# Patient Record
Sex: Male | Born: 1990 | Race: Black or African American | Hispanic: No | Marital: Single | Smoking: Current every day smoker
Health system: Southern US, Community
[De-identification: ages and names within clinical notes are randomized; demographics above are authoritative.]

## PROBLEM LIST (undated history)

## (undated) DIAGNOSIS — E119 Type 2 diabetes mellitus without complications: Secondary | ICD-10-CM

## (undated) DIAGNOSIS — F1910 Other psychoactive substance abuse, uncomplicated: Secondary | ICD-10-CM

---
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
## 2013-03-09 ENCOUNTER — Emergency Department (INDEPENDENT_AMBULATORY_CARE_PROVIDER_SITE_OTHER)
Admission: EM | Admit: 2013-03-09 | Discharge: 2013-03-09 | Disposition: A | Discharge status: Home / Self Care | Payer: Self-pay | Source: Home / Self Care | Attending: Family Medicine | Admitting: Family Medicine

## 2013-03-09 ENCOUNTER — Encounter (HOSPITAL_COMMUNITY): Payer: Self-pay | Admitting: Emergency Medicine

## 2013-03-09 DIAGNOSIS — IMO0002 Reserved for concepts with insufficient information to code with codable children: Secondary | ICD-10-CM

## 2013-03-09 DIAGNOSIS — M79609 Pain in unspecified limb: Secondary | ICD-10-CM

## 2013-03-09 DIAGNOSIS — S39012A Strain of muscle, fascia and tendon of lower back, initial encounter: Secondary | ICD-10-CM

## 2013-03-09 DIAGNOSIS — G8929 Other chronic pain: Secondary | ICD-10-CM

## 2013-03-09 MED ORDER — TRAMADOL HCL 50 MG PO TABS: 50.0000 mg | ORAL_TABLET | Freq: Four times a day (QID) | ORAL | Status: DC | PRN

## 2013-03-09 MED ORDER — HYDROCODONE-ACETAMINOPHEN 5-325 MG PO TABS
ORAL_TABLET | ORAL | Status: AC
  Filled 2013-03-09: qty 1

## 2013-03-09 MED ORDER — CYCLOBENZAPRINE HCL 5 MG PO TABS: 5.0000 mg | ORAL_TABLET | Freq: Three times a day (TID) | ORAL | Status: DC | PRN

## 2013-03-09 MED ORDER — HYDROCODONE-ACETAMINOPHEN 5-325 MG PO TABS
1.0000 | ORAL_TABLET | Freq: Once | ORAL | Status: AC
  Administered 2013-03-09: 1 via ORAL

## 2013-03-09 MED ORDER — DICLOFENAC SODIUM 75 MG PO TBEC: 75.0000 mg | DELAYED_RELEASE_TABLET | Freq: Two times a day (BID) | ORAL | Status: DC

## 2013-03-09 NOTE — ED Provider Notes (Signed)
CSN: [id number]     Arrival date & time 03/09/13  1819 History   First MD Initiated Contact with Patient 03/09/13 1908     Chief Complaint  Patient presents with  . Back Pain   (Consider location/radiation/quality/duration/timing/severity/associated sxs/prior Treatment) Patient is a 22 y.o. male presenting with back pain. The history is provided by the patient.  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 Worse with standing. Better with brace. Has not tried ice/heat. States he is flat footed.  History reviewed. No pertinent past medical history. Past Surgical History  Procedure Laterality Date  . Hip surgery  2005    L hip dislocation-pin inserted    History reviewed. No pertinent family history. History  Substance Use Topics  . Smoking status: Current Every Day Smoker -- 0.50 packs/day    Types: Cigarettes  . Smokeless tobacco: Not on file  . Alcohol Use: Yes     Comment: occasional    Review of Systems  Constitutional: Negative for fever.  Gastrointestinal: Negative for bowel incontinence.  Genitourinary: Negative for bladder incontinence.  Musculoskeletal: Positive for back pain, gait problem and myalgias.  Skin: Negative for rash.  Neurological: Negative for tingling, weakness and numbness.   Allergies  Review of patient's allergies indicates no known allergies.  Home Medications   Current  Outpatient Rx  Name  Route  Sig  Dispense  Refill  . cyclobenzaprine (FLEXERIL) 5 MG tablet   Oral   Take 1 tablet (5 mg total) by mouth 3 (three) times daily as needed for muscle spasms (Will make you sleepy.).   10 tablet   0   . diclofenac (VOLTAREN) 75 MG EC tablet   Oral   Take 1 tablet (75 mg total) by mouth 2 (two) times daily.   30 tablet   0   . traMADol (ULTRAM) 50 MG tablet   Oral   Take 1 tablet (50 mg total) by mouth every 6 (six) hours as needed for pain.   10 tablet   0    BP 112/65  Pulse 91  Temp(Src) 98.4 F (36.9 C) (Oral)  Resp 16  SpO2 100% Physical Exam  Constitutional: He is oriented to person, place, and time. He appears well-developed and well-nourished. No distress.  HENT:  Head: Normocephalic and atraumatic.  Mouth/Throat: Oropharynx is clear and moist.  Eyes: Conjunctivae are normal. Pupils are equal, round, and reactive to light.  Neck: Normal range of motion. Neck supple.  Cardiovascular: Normal rate, regular rhythm and normal heart sounds.   No murmur heard. Pulmonary/Chest: Effort normal and breath sounds normal. He has no wheezes.  Abdominal: Soft. He exhibits no distension. There is no tenderness.  Obese  Musculoskeletal: Normal range of motion. He exhibits no edema.       Left ankle: He exhibits normal range of motion, no swelling and no deformity. Tenderness. Lateral malleolus tenderness found. Achilles tendon normal.  Lumbar back: He exhibits tenderness (Paraspinal). He exhibits normal range of motion and no deformity.  Good ROM of entire back. 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  Neurological: He is alert and oriented to person, place, and time.  Skin: Skin is warm and dry.  Psychiatric: He has a normal mood and affect.    ED Course  Procedures (including critical care time) Labs Review Labs Reviewed - No data to display Imaging Review No results found.   MDM    1. Back strain, initial encounter   2. Chronic foot pain, left    22 yo M with back pain and foot pain - Back pain likely secondary to muscle strain from overuse - Encouraged weight loss and better technique with heavy lifting - Vicodin x1 here, given Rx for Flexeril and Diclofenac. - Ankle is stable and no red flags. Intermittent pain with long standing suggestive that pain is from the anatomy of his foot, likely his fallen arches - Wear supportive shoes, avoid sandals/flip flops - Take breaks as needed - Establish PCP for closer follow up    Hilarie Fredrickson, MD 03/09/13 2016

## 2013-03-09 NOTE — ED Notes (Signed)
C/o back pain onset 2 mos. Ago.  C/o L foot pain onset 10/22, when he stands on it a long time.  No known injury to either one.

## 2013-03-11 NOTE — ED Provider Notes (Signed)
Medical screening examination/treatment/procedure(s) were performed by a resident physician or non-physician practitioner and as the supervising physician I was immediately available for consultation/collaboration.  Seven Marengo, MD  Asalee Barrette S Ashlyn Cabler, MD 03/11/13 0840 

## 2017-09-05 ENCOUNTER — Encounter (HOSPITAL_COMMUNITY): Payer: Self-pay

## 2017-09-05 ENCOUNTER — Emergency Department (HOSPITAL_COMMUNITY)
Admission: EM | Admit: 2017-09-05 | Discharge: 2017-09-05 | Disposition: A | Discharge status: Home / Self Care | Payer: Self-pay | Attending: Emergency Medicine | Admitting: Emergency Medicine

## 2017-09-05 DIAGNOSIS — Z79899 Other long term (current) drug therapy: Secondary | ICD-10-CM | POA: Insufficient documentation

## 2017-09-05 DIAGNOSIS — F17210 Nicotine dependence, cigarettes, uncomplicated: Secondary | ICD-10-CM | POA: Insufficient documentation

## 2017-09-05 DIAGNOSIS — H66001 Acute suppurative otitis media without spontaneous rupture of ear drum, right ear: Secondary | ICD-10-CM | POA: Insufficient documentation

## 2017-09-05 MED ORDER — IBUPROFEN 200 MG PO TABS
600.0000 mg | ORAL_TABLET | Freq: Once | ORAL | Status: AC
  Administered 2017-09-05: 600 mg via ORAL
  Filled 2017-09-05: qty 1

## 2017-09-05 MED ORDER — AMOXICILLIN-POT CLAVULANATE 875-125 MG PO TABS: 1.0000 | ORAL_TABLET | Freq: Two times a day (BID) | ORAL | 0 refills | Status: AC

## 2017-09-05 NOTE — ED Provider Notes (Signed)
[facility]   CSN: [id number] Arrival date & time: 09/05/17  1249     History   Chief Complaint Chief Complaint  Patient presents with  . Otalgia    HPI Jimmy Jarvis is a 27 y.o. male.  27yo M who p/w right ear pain.  The patient recently had a cold/upper respiratory illness with cough and fevers.  He states that his symptoms have been improving but he is continued to have congestion.  For the past several days, he has had right ear pain.  He woke up this morning and the pain was severe.  The pain has been throbbing and radiating to the right side of his face.  No drainage from his ear.  He does report decreased hearing on that side.  No medications prior to arrival.  The history is provided by the patient.  Otalgia     History reviewed. No pertinent past medical history.  There are no active problems to display for this patient.   Past Surgical History:  Procedure Laterality Date  . HIP SURGERY  2005   L hip dislocation-pin inserted         Home Medications    Prior to Admission medications   Medication Sig Start Date End Date Taking? Authorizing Provider  amoxicillin-clavulanate (AUGMENTIN) 875-125 MG tablet Take 1 tablet by mouth every 12 (twelve) hours for 10 days. 09/05/17 09/15/17  Brionna Romanek, Ambrose Finlandachel Morgan, MD  cyclobenzaprine (FLEXERIL) 5 MG tablet Take 1 tablet (5 mg total) by mouth 3 (three) times daily as needed for muscle spasms (Will make you sleepy.). 03/09/13   Hairford, Ricki MillerAmber M, MD  diclofenac (VOLTAREN) 75 MG EC tablet Take 1 tablet (75 mg total) by mouth 2 (two) times daily. 03/09/13   Hairford, Ricki MillerAmber M, MD  traMADol (ULTRAM) 50 MG tablet Take 1 tablet (50 mg total) by mouth every 6 (six) hours as needed for pain. 03/09/13   Hairford, Ricki MillerAmber M, MD    Family History No family history on file.  Social History Social History   Tobacco Use  . Smoking status: Current Every Day Smoker    Packs/day:  0.50    Types: Cigarettes  . Smokeless tobacco: Never Used  Substance Use Topics  . Alcohol use: Yes    Comment: occasional  . Drug use: No     Allergies   Patient has no known allergies.   Review of Systems Review of Systems  HENT: Positive for ear pain.    All other systems reviewed and are negative except that which was mentioned in HPI   Physical Exam Updated Vital Signs BP (!) 154/110 (BP Location: Left Arm)   Pulse 86   Temp 98.5 F (36.9 C) (Oral)   Resp 16   SpO2 98%   Physical Exam  Constitutional: He is oriented to person, place, and time. He appears well-developed and well-nourished. No distress.  HENT:  Head: Normocephalic and atraumatic.  Right Ear: No drainage. Tympanic membrane is erythematous and bulging. Tympanic membrane is not perforated.  Left Ear: Tympanic membrane and ear canal normal.  Mouth/Throat: Oropharynx is clear and moist.  Moist mucous membranes  Eyes: Pupils are equal, round, and reactive to light. Conjunctivae are normal.  Neck: Neck supple.  Cardiovascular: Normal rate, regular rhythm and normal heart sounds.  No murmur heard. Pulmonary/Chest: Effort normal and breath sounds normal.  Abdominal: Soft. Bowel sounds are normal. He exhibits no distension. There is no tenderness.  Musculoskeletal: He  exhibits no edema.  Lymphadenopathy:    He has no cervical adenopathy.  Neurological: He is alert and oriented to person, place, and time.  Fluent speech  Skin: Skin is warm and dry.  Psychiatric: He has a normal mood and affect. Judgment normal.  Nursing note and vitals reviewed.    ED Treatments / Results  Labs (all labs ordered are listed, but only abnormal results are displayed) Labs Reviewed - No data to display  EKG None  Radiology No results found.  Procedures Procedures (including critical care time)  Medications Ordered in ED Medications  ibuprofen (ADVIL,MOTRIN) tablet 600 mg (600 mg Oral Given 09/05/17 1354)      Initial Impression / Assessment and Plan / ED Course  I have reviewed the triage vital signs and the nursing notes.     R otitis media on exam without perforation. Recommended antibiotic treatment. Incidentally found to have HTN at triage. Recommended clinic f/u for recheck.  Discussed supportive measures for ear pain and reviewed return precautions.  Final Clinical Impressions(s) / ED Diagnoses   Final diagnoses:  Non-recurrent acute suppurative otitis media of right ear without spontaneous rupture of tympanic membrane    ED Discharge Orders        Ordered    amoxicillin-clavulanate (AUGMENTIN) 875-125 MG tablet  Every 12 hours     09/05/17 1350       Esaul Dorwart, Ambrose Finland, MD 09/05/17 1359

## 2017-09-05 NOTE — ED Triage Notes (Signed)
PT reports he has been experiencing right ear pain x several days and woke up this morning to ear throbbing, pain and swelling radiating into face.

## 2017-09-05 NOTE — ED Notes (Signed)
NAD at this time. Pt is stable and going home. Pt tried to ESign, however the signature pad is broken in 11.

## 2019-02-25 ENCOUNTER — Emergency Department (HOSPITAL_COMMUNITY)
Admission: EM | Admit: 2019-02-25 | Discharge: 2019-02-25 | Disposition: A | Discharge status: Home / Self Care | Payer: Self-pay | Attending: Emergency Medicine | Admitting: Emergency Medicine

## 2019-02-25 ENCOUNTER — Encounter (HOSPITAL_COMMUNITY): Payer: Self-pay | Admitting: *Deleted

## 2019-02-25 ENCOUNTER — Other Ambulatory Visit: Payer: Self-pay

## 2019-02-25 DIAGNOSIS — F1910 Other psychoactive substance abuse, uncomplicated: Secondary | ICD-10-CM | POA: Insufficient documentation

## 2019-02-25 DIAGNOSIS — F17210 Nicotine dependence, cigarettes, uncomplicated: Secondary | ICD-10-CM | POA: Insufficient documentation

## 2019-02-25 DIAGNOSIS — Z79899 Other long term (current) drug therapy: Secondary | ICD-10-CM | POA: Insufficient documentation

## 2019-02-25 DIAGNOSIS — R1084 Generalized abdominal pain: Secondary | ICD-10-CM | POA: Insufficient documentation

## 2019-02-25 MED ORDER — NAPROXEN 500 MG PO TABS: 500.0000 mg | ORAL_TABLET | Freq: Two times a day (BID) | ORAL | Status: DC | PRN

## 2019-02-25 MED ORDER — DICYCLOMINE HCL 20 MG PO TABS
20.0000 mg | ORAL_TABLET | Freq: Four times a day (QID) | ORAL | Status: DC | PRN
  Administered 2019-02-25: 06:00:00 20 mg via ORAL
  Filled 2019-02-25: qty 1

## 2019-02-25 MED ORDER — ONDANSETRON 4 MG PO TBDP: 4.0000 mg | ORAL_TABLET | Freq: Three times a day (TID) | ORAL | 0 refills | Status: DC | PRN

## 2019-02-25 MED ORDER — NAPROXEN 500 MG PO TABS: 500.0000 mg | ORAL_TABLET | Freq: Two times a day (BID) | ORAL | 0 refills | Status: DC

## 2019-02-25 MED ORDER — HYDROXYZINE HCL 25 MG PO TABS: 25.0000 mg | ORAL_TABLET | Freq: Four times a day (QID) | ORAL | Status: DC | PRN

## 2019-02-25 MED ORDER — CLONIDINE HCL 0.1 MG PO TABS
0.1000 mg | ORAL_TABLET | Freq: Once | ORAL | Status: AC
  Administered 2019-02-25: 0.1 mg via ORAL
  Filled 2019-02-25: qty 1

## 2019-02-25 MED ORDER — ONDANSETRON 8 MG PO TBDP
8.0000 mg | ORAL_TABLET | Freq: Once | ORAL | Status: AC
  Administered 2019-02-25: 06:00:00 8 mg via ORAL
  Filled 2019-02-25: qty 1

## 2019-02-25 MED ORDER — HYDROXYZINE HCL 25 MG PO TABS: 25.0000 mg | ORAL_TABLET | Freq: Four times a day (QID) | ORAL | 0 refills | Status: DC

## 2019-02-25 NOTE — ED Provider Notes (Signed)
[place] DEPT Provider Note   CSN: [id number] Arrival date & time: 02/25/19  0449     History   Chief Complaint Chief Complaint  Patient presents with  . Drug Problem    HPI Jimmy Jarvis is a 28 y.o. male.     Patient presents to the emergency department with a chief complaint of opiate withdrawal.  He states that he got a new job, and has been detoxing off of oxycodone and fentanyl.  He states that his last use was a day and a half ago.  He complains of nausea, abdominal cramps, and anxiousness.  He denies any diarrhea or body aches/muscle spasms.  Denies any other associated symptoms.  He has not taken anything for his symptoms.  The history is provided by the patient. No language interpreter was used.    History reviewed. No pertinent past medical history.  There are no active problems to display for this patient.   Past Surgical History:  Procedure Laterality Date  . HIP SURGERY  2005   L hip dislocation-pin inserted         Home Medications    Prior to Admission medications   Medication Sig Start Date End Date Taking? Authorizing Provider  cyclobenzaprine (FLEXERIL) 5 MG tablet Take 1 tablet (5 mg total) by mouth 3 (three) times daily as needed for muscle spasms (Will make you sleepy.). 03/09/13   Hairford, Tyler Pita, MD  diclofenac (VOLTAREN) 75 MG EC tablet Take 1 tablet (75 mg total) by mouth 2 (two) times daily. 03/09/13   Hairford, Tyler Pita, MD  hydrOXYzine (ATARAX/VISTARIL) 25 MG tablet Take 1 tablet (25 mg total) by mouth every 6 (six) hours. 02/25/19   Montine Circle, PA-C  naproxen (NAPROSYN) 500 MG tablet Take 1 tablet (500 mg total) by mouth 2 (two) times daily with a meal. 02/25/19   Montine Circle, PA-C  ondansetron (ZOFRAN ODT) 4 MG disintegrating tablet Take 1 tablet (4 mg total) by mouth every 8 (eight) hours as needed for nausea or vomiting. 02/25/19   Montine Circle, PA-C  traMADol (ULTRAM) 50 MG tablet  Take 1 tablet (50 mg total) by mouth every 6 (six) hours as needed for pain. 03/09/13   Hairford, Tyler Pita, MD    Family History No family history on file.  Social History Social History   Tobacco Use  . Smoking status: Current Every Day Smoker    Packs/day: 0.50    Types: Cigarettes  . Smokeless tobacco: Never Used  Substance Use Topics  . Alcohol use: Yes    Comment: occasional  . Drug use: No     Allergies   Patient has no known allergies.   Review of Systems Review of Systems  All other systems reviewed and are negative.    Physical Exam Updated Vital Signs BP (!) 151/82   Pulse 72   Temp 98.7 F (37.1 C) (Oral)   Resp 20   SpO2 98%   Physical Exam Vitals signs and nursing note reviewed.  Constitutional:      Appearance: He is well-developed.  HENT:     Head: Normocephalic and atraumatic.  Eyes:     Conjunctiva/sclera: Conjunctivae normal.  Neck:     Musculoskeletal: Neck supple.  Cardiovascular:     Rate and Rhythm: Normal rate and regular rhythm.     Heart sounds: No murmur.  Pulmonary:     Effort: Pulmonary effort is normal. No respiratory distress.     Breath sounds: Normal  breath sounds.  Abdominal:     Palpations: Abdomen is soft.     Tenderness: There is no abdominal tenderness.  Musculoskeletal: Normal range of motion.  Skin:    General: Skin is warm and dry.  Neurological:     Mental Status: He is alert and oriented to person, place, and time.  Psychiatric:        Mood and Affect: Mood normal.        Behavior: Behavior normal.      ED Treatments / Results  Labs (all labs ordered are listed, but only abnormal results are displayed) Labs Reviewed - No data to display  EKG None  Radiology No results found.  Procedures Procedures (including critical care time)  Medications Ordered in ED Medications  ondansetron (ZOFRAN-ODT) disintegrating tablet 8 mg (has no administration in time range)  dicyclomine (BENTYL) tablet 20 mg  (has no administration in time range)  hydrOXYzine (ATARAX/VISTARIL) tablet 25 mg (has no administration in time range)  naproxen (NAPROSYN) tablet 500 mg (has no administration in time range)  cloNIDine (CATAPRES) tablet 0.1 mg (has no administration in time range)     Initial Impression / Assessment and Plan / ED Course  I have reviewed the triage vital signs and the nursing notes.  Pertinent labs & imaging results that were available during my care of the patient were reviewed by me and considered in my medical decision making (see chart for details).        Patient here for opiate withdrawal.  Vital signs are stable.  Patient given symptomatic treatment in the ED.  Offered outpatient resources, but patient declined.  Final Clinical Impressions(s) / ED Diagnoses   Final diagnoses:  Polysubstance abuse [facility])    ED Discharge Orders         Ordered    hydrOXYzine (ATARAX/VISTARIL) 25 MG tablet  Every 6 hours     02/25/19 0538    ondansetron (ZOFRAN ODT) 4 MG disintegrating tablet  Every 8 hours PRN     02/25/19 0538    naproxen (NAPROSYN) 500 MG tablet  2 times daily with meals     02/25/19 0538           Roxy Horseman, PA-C 02/25/19 0540    Nira Conn, MD 02/28/19 2109

## 2019-02-25 NOTE — ED Triage Notes (Signed)
Pt arrives with c/o wanting help with withdrawal symptoms. Reports has been using fentanyl and percocet (snorting) for about 2 years. Last use about 3 days ago.

## 2019-02-25 NOTE — ED Notes (Signed)
Patient states that he feels like he going thru withdrawal, feeling hot and cold, restless and tingling sensation in the throat .

## 2019-10-04 ENCOUNTER — Ambulatory Visit: Payer: Self-pay | Admitting: Family Medicine

## 2019-11-08 ENCOUNTER — Ambulatory Visit: Payer: Self-pay | Admitting: Family Medicine

## 2019-11-27 ENCOUNTER — Ambulatory Visit: Payer: Self-pay | Attending: Family Medicine | Admitting: Internal Medicine

## 2019-11-27 ENCOUNTER — Other Ambulatory Visit: Payer: Self-pay

## 2021-12-17 ENCOUNTER — Emergency Department (HOSPITAL_COMMUNITY)
Admission: EM | Admit: 2021-12-17 | Discharge: 2021-12-18 | Disposition: A | Discharge status: Home / Self Care | Payer: Self-pay | Attending: Emergency Medicine | Admitting: Emergency Medicine

## 2021-12-17 ENCOUNTER — Encounter (HOSPITAL_COMMUNITY): Payer: Self-pay | Admitting: *Deleted

## 2021-12-17 ENCOUNTER — Other Ambulatory Visit: Payer: Self-pay

## 2021-12-17 DIAGNOSIS — I10 Essential (primary) hypertension: Secondary | ICD-10-CM | POA: Insufficient documentation

## 2021-12-17 DIAGNOSIS — F1193 Opioid use, unspecified with withdrawal: Secondary | ICD-10-CM | POA: Insufficient documentation

## 2021-12-17 DIAGNOSIS — T44905A Adverse effect of unspecified drugs primarily affecting the autonomic nervous system, initial encounter: Secondary | ICD-10-CM

## 2021-12-17 DIAGNOSIS — R112 Nausea with vomiting, unspecified: Secondary | ICD-10-CM | POA: Insufficient documentation

## 2021-12-17 DIAGNOSIS — Z79899 Other long term (current) drug therapy: Secondary | ICD-10-CM | POA: Insufficient documentation

## 2021-12-17 DIAGNOSIS — E119 Type 2 diabetes mellitus without complications: Secondary | ICD-10-CM | POA: Insufficient documentation

## 2021-12-17 DIAGNOSIS — F200 Paranoid schizophrenia: Secondary | ICD-10-CM | POA: Insufficient documentation

## 2021-12-17 HISTORY — DX: Other psychoactive substance abuse, uncomplicated: F19.10

## 2021-12-17 HISTORY — DX: Type 2 diabetes mellitus without complications: E11.9

## 2021-12-17 LAB — RAPID URINE DRUG SCREEN, HOSP PERFORMED
Amphetamines: POSITIVE — AB
Barbiturates: NOT DETECTED
Benzodiazepines: POSITIVE — AB
Cocaine: POSITIVE — AB
Opiates: NOT DETECTED
Tetrahydrocannabinol: NOT DETECTED

## 2021-12-17 LAB — CBG MONITORING, ED: Glucose-Capillary: 91 mg/dL (ref 70–99)

## 2021-12-17 LAB — COMPREHENSIVE METABOLIC PANEL
ALT: 24 U/L (ref 0–44)
AST: 36 U/L (ref 15–41)
Albumin: 4.9 g/dL (ref 3.5–5.0)
Alkaline Phosphatase: 91 U/L (ref 38–126)
Anion gap: 12 (ref 5–15)
BUN: 14 mg/dL (ref 6–20)
CO2: 25 mmol/L (ref 22–32)
Calcium: 10.3 mg/dL (ref 8.9–10.3)
Chloride: 104 mmol/L (ref 98–111)
Creatinine, Ser: 1.18 mg/dL (ref 0.61–1.24)
GFR, Estimated: >60 mL/min (ref >60)
Glucose, Bld: 104 mg/dL — ABNORMAL HIGH (ref 70–99)
Potassium: 3.5 mmol/L (ref 3.5–5.1)
Sodium: 141 mmol/L (ref 135–145)
Total Bilirubin: 1.2 mg/dL (ref 0.3–1.2)
Total Protein: 9.2 g/dL — ABNORMAL HIGH (ref 6.5–8.1)

## 2021-12-17 LAB — CBC
HCT: 56.1 % — ABNORMAL HIGH (ref 39.0–52.0)
Hemoglobin: 17.4 g/dL — ABNORMAL HIGH (ref 13.0–17.0)
MCH: 22.3 pg — ABNORMAL LOW (ref 26.0–34.0)
MCHC: 31 g/dL (ref 30.0–36.0)
MCV: 72 fL — ABNORMAL LOW (ref 80.0–100.0)
Platelets: 329 10*3/uL (ref 150–400)
RBC: 7.79 MIL/uL — ABNORMAL HIGH (ref 4.22–5.81)
RDW: 17.4 % — ABNORMAL HIGH (ref 11.5–15.5)
WBC: 9.4 10*3/uL (ref 4.0–10.5)
nRBC: 0 % (ref 0.0–0.2)

## 2021-12-17 LAB — MAGNESIUM: Magnesium: 2.1 mg/dL (ref 1.7–2.4)

## 2021-12-17 LAB — ETHANOL: Alcohol, Ethyl (B): <10 mg/dL (ref <10)

## 2021-12-17 MED ORDER — LACTATED RINGERS IV BOLUS
1000.0000 mL | Freq: Once | INTRAVENOUS | Status: AC
  Administered 2021-12-17: 1000 mL via INTRAVENOUS

## 2021-12-17 MED ORDER — PROCHLORPERAZINE EDISYLATE 10 MG/2ML IJ SOLN
10.0000 mg | INTRAMUSCULAR | Status: AC
  Administered 2021-12-17: 10 mg via INTRAVENOUS
  Filled 2021-12-17: qty 2

## 2021-12-17 MED ORDER — MIDAZOLAM HCL 2 MG/2ML IJ SOLN
4.0000 mg | Freq: Once | INTRAMUSCULAR | Status: AC
  Administered 2021-12-17: 4 mg via INTRAVENOUS

## 2021-12-17 MED ORDER — LACTATED RINGERS IV BOLUS
1000.0000 mL | Freq: Once | INTRAVENOUS | Status: AC
  Administered 2021-12-18: 1000 mL via INTRAVENOUS

## 2021-12-17 MED ORDER — ONDANSETRON HCL 4 MG/2ML IJ SOLN
4.0000 mg | Freq: Once | INTRAMUSCULAR | Status: AC
Start: 2021-12-17 — End: 2021-12-17
  Administered 2021-12-17: 4 mg via INTRAVENOUS
  Filled 2021-12-17: qty 2

## 2021-12-17 MED ORDER — MIDAZOLAM HCL 2 MG/2ML IJ SOLN
4.0000 mg | Freq: Once | INTRAMUSCULAR | Status: AC
  Administered 2021-12-17: 4 mg via INTRAVENOUS
  Filled 2021-12-17: qty 4

## 2021-12-17 MED ORDER — DIPHENHYDRAMINE HCL 50 MG/ML IJ SOLN
25.0000 mg | Freq: Once | INTRAMUSCULAR | Status: AC
  Administered 2021-12-17: 25 mg via INTRAVENOUS
  Filled 2021-12-17: qty 1

## 2021-12-17 MED ORDER — MIDAZOLAM HCL (PF) 10 MG/2ML IJ SOLN
INTRAMUSCULAR | Status: AC
  Filled 2021-12-17: qty 2

## 2021-12-17 MED ORDER — BUPRENORPHINE HCL-NALOXONE HCL 2-0.5 MG SL SUBL
2.0000 | SUBLINGUAL_TABLET | SUBLINGUAL | Status: AC
  Administered 2021-12-17: 2 via SUBLINGUAL
  Filled 2021-12-17: qty 2

## 2021-12-17 MED ORDER — DIAZEPAM 5 MG/ML IJ SOLN
10.0000 mg | Freq: Once | INTRAMUSCULAR | Status: AC
  Administered 2021-12-18: 10 mg via INTRAVENOUS
  Filled 2021-12-17: qty 2

## 2021-12-17 MED ORDER — BUPRENORPHINE HCL-NALOXONE HCL 2-0.5 MG SL SUBL
2.0000 | SUBLINGUAL_TABLET | SUBLINGUAL | Status: AC
  Administered 2021-12-18: 2 via SUBLINGUAL
  Filled 2021-12-17: qty 2

## 2021-12-17 NOTE — ED Triage Notes (Addendum)
BIB EMS with withdrawals after 2 days without Heroin. Paranoid and Hallucinations, vomiting, shaky chills. CBG [facility] told EMS he has been ICE and Meth.

## 2021-12-17 NOTE — ED Provider Notes (Addendum)
  Provider Note MRN:  [id number]  Arrival date & time: 12/18/21    ED Course and Medical Decision Making  Assumed care from Dr. Eloise Harman at shift change.  Patient endorsing opioid withdrawal but we also expect sympathomimetic toxidrome, currently not tolerating p.o., awaiting symptomatic management, repeat labs, will reassess.  Patient is sitting up in bed, no acute distress, no toxidrome and features at this time, CK mildly elevated at 1500 with no acute kidney injury, advised repeat check with PCP or here in the emergency department within the next week or so.  Appropriate for discharge.  Procedures  Final Clinical Impressions(s) / ED Diagnoses     ICD-10-CM   1. Adverse effect of sympathomimetics, initial encounter  T44.905A     2. Opioid withdrawal (HCC)  F11.93     3. Nausea and vomiting, unspecified vomiting type  R11.2       ED Discharge Orders     None         Discharge Instructions      You were evaluated in the Emergency Department and after careful evaluation, we did not find any emergent condition requiring admission or further testing in the hospital.  Your exam/testing today was overall reassuring.  Important that you stop using illicit drugs and follow-up closely with your primary care doctor.  Also recommend following up with the outpatient substance use resources.  Please return to the Emergency Department if you experience any worsening of your condition.  Thank you for allowing [place] to be a part of your care.       Elmer Sow. Pilar Plate, MD [facility] Emergency Medicine [facility] [email or web address]    Sabas Sous, MD 12/18/21 3202    Sabas Sous, MD 12/18/21 [phone]

## 2021-12-17 NOTE — BH Assessment (Signed)
Clinician messaged Electa Sniff, RN: "Hey. It's Jimmy Jarvis with TTS. Is the pt able to engage in the assessment, if so the pt will need to be placed in a private room. Is the pt under IVC? Also is the pt medically cleared?"   Clinician awaiting response.    Redmond Pulling, MS, [facility], [facility] Triage Specialist [phone]

## 2021-12-17 NOTE — ED Provider Notes (Signed)
[place] DEPT Provider Note   CSN: [id number] Arrival date & time: 12/17/21  1557     History  Chief Complaint  Patient presents with   Paranoid   Hallucinations    Jimmy Jarvis is a 31 y.o. male.  31 year old male with a history of polysubstance abuse and diabetes who presents to the emergency department with concerns for withdrawal.  Initial report from EMS is that the patient was paranoid and having hallucinations.  However, after discussing with the patient he has had no audiovisual hallucinations.  Says that instead he is withdrawing from reports that and said he is withdrawing from heroin and "ice" (believed to be methamphetamine).  States that he last used heroin yesterday and ice today.  Denies any chest pain or shortness of breath.  Does feel very anxious with tremor, nausea, vomiting, and some mild diarrhea.  Says that he smokes and snorts his heroin and does not inject.  Says he is not having thoughts of harming himself or others at this time.    Past Medical History:  Diagnosis Date   Diabetes mellitus without complication ([place])    Drug abuse ([place])         Home Medications Prior to Admission medications   Medication Sig Start Date End Date Taking? Authorizing Provider  cyclobenzaprine (FLEXERIL) 5 MG tablet Take 1 tablet (5 mg total) by mouth 3 (three) times daily as needed for muscle spasms (Will make you sleepy.). 03/09/13   Hairford, Tyler Pita, MD  diclofenac (VOLTAREN) 75 MG EC tablet Take 1 tablet (75 mg total) by mouth 2 (two) times daily. 03/09/13   Hairford, Tyler Pita, MD  hydrOXYzine (ATARAX/VISTARIL) 25 MG tablet Take 1 tablet (25 mg total) by mouth every 6 (six) hours. 02/25/19   Montine Circle, PA-C  naproxen (NAPROSYN) 500 MG tablet Take 1 tablet (500 mg total) by mouth 2 (two) times daily with a meal. 02/25/19   Montine Circle, PA-C  ondansetron (ZOFRAN ODT) 4 MG disintegrating tablet Take 1 tablet (4 mg total) by  mouth every 8 (eight) hours as needed for nausea or vomiting. 02/25/19   Montine Circle, PA-C  traMADol (ULTRAM) 50 MG tablet Take 1 tablet (50 mg total) by mouth every 6 (six) hours as needed for pain. 03/09/13   Hairford, Tyler Pita, MD      Allergies    Patient has no known allergies.    Review of Systems   Review of Systems  Physical Exam Updated Vital Signs BP (!) 184/101 (BP Location: Left Wrist)   Pulse 88   Temp 100.1 F (37.8 C) (Oral)   Resp (!) 25   SpO2 97%  Physical Exam Vitals and nursing note reviewed.  Constitutional:      Appearance: He is well-developed. He is diaphoretic.     Comments: Anxious pacing about room.  Occasionally vomiting into handheld emesis bag.  Answering questions appropriately.  HENT:     Head: Normocephalic and atraumatic.     Right Ear: External ear normal.     Left Ear: External ear normal.     Nose: Nose normal.     Mouth/Throat:     Mouth: Mucous membranes are moist.     Pharynx: Oropharynx is clear.  Eyes:     Extraocular Movements: Extraocular movements intact.     Conjunctiva/sclera: Conjunctivae normal.     Pupils: Pupils are equal, round, and reactive to light.  Cardiovascular:     Rate and Rhythm: Regular rhythm. Tachycardia  present.     Pulses: Normal pulses.     Heart sounds: Normal heart sounds. No murmur heard. Pulmonary:     Effort: Pulmonary effort is normal. No respiratory distress.     Breath sounds: Normal breath sounds.  Abdominal:     General: There is no distension.     Palpations: Abdomen is soft.  Musculoskeletal:        General: No swelling.     Cervical back: Neck supple.     Right lower leg: No edema.     Left lower leg: No edema.  Skin:    General: Skin is warm.     Capillary Refill: Capillary refill takes 2 to 3 seconds.  Neurological:     General: No focal deficit present.     Mental Status: He is alert and oriented to person, place, and time.  Psychiatric:     Comments: Anxious.  Is not  responding to internal stimuli.  Has coherent thought processes.     ED Results / Procedures / Treatments   Labs (all labs ordered are listed, but only abnormal results are displayed) Labs Reviewed  COMPREHENSIVE METABOLIC PANEL - Abnormal; Notable for the following components:      Result Value   Glucose, Bld 104 (*)    Total Protein 9.2 (*)    All other components within normal limits  CBC - Abnormal; Notable for the following components:   RBC 7.79 (*)    Hemoglobin 17.4 (*)    HCT 56.1 (*)    MCV 72.0 (*)    MCH 22.3 (*)    RDW 17.4 (*)    All other components within normal limits  RAPID URINE DRUG SCREEN, HOSP PERFORMED - Abnormal; Notable for the following components:   Cocaine POSITIVE (*)    Benzodiazepines POSITIVE (*)    Amphetamines POSITIVE (*)    All other components within normal limits  ETHANOL  MAGNESIUM  CK  BASIC METABOLIC PANEL  MAGNESIUM  PHOSPHORUS  CBG MONITORING, ED    EKG EKG Interpretation  Date/Time:  Thursday December 17 2021 16:24:59 EDT Ventricular Rate:  123 PR Interval:  155 QRS Duration: 115 QT Interval:  351 QTC Calculation: 432 R Axis:   99 Text Interpretation: Sinus tachycardia Multiform ventricular premature complexes Nonspecific intraventricular conduction delay ST elev, probable normal early repol pattern Confirmed by Kennis Carina [phone]) on 12/17/2021 11:38:45 PM  Radiology No results found.  Procedures Procedures  Telemetry interpretation - Atrial Bigeminy  Medications Ordered in ED Medications  midazolam PF (VERSED) 10 MG/2ML injection (has no administration in time range)  midazolam (VERSED) injection 4 mg (4 mg Intravenous Given 12/17/21 1700)  prochlorperazine (COMPAZINE) injection 10 mg (10 mg Intravenous Given 12/17/21 1659)  diphenhydrAMINE (BENADRYL) injection 25 mg (25 mg Intravenous Given 12/17/21 1801)  lactated ringers bolus 1,000 mL (1,000 mLs Intravenous New Bag/Given 12/17/21 1810)  ondansetron (ZOFRAN) injection  4 mg (4 mg Intravenous Given 12/17/21 1842)  buprenorphine-naloxone (SUBOXONE) 2-0.5 mg per SL tablet 2 tablet (2 tablets Sublingual Given 12/17/21 2050)  midazolam (VERSED) injection 4 mg (4 mg Intravenous Given 12/17/21 2042)  buprenorphine-naloxone (SUBOXONE) 2-0.5 mg per SL tablet 2 tablet (2 tablets Sublingual Given 12/18/21 0028)  diazepam (VALIUM) injection 10 mg (10 mg Intravenous Given 12/18/21 0028)  lactated ringers bolus 1,000 mL (1,000 mLs Intravenous Bolus 12/18/21 0032)    ED Course/ Medical Decision Making/ A&P  Medical Decision Making 31 year old male he of polysubstance abuse who presents emergency department with concerns for withdrawal, anxiety, and nausea and vomiting.  Appears that the patient may be withdrawing from opiates while still intoxicated with sympathomimetics.  Appears the patient did ice earlier today which is likely methamphetamine.  He had a urine drug screen that was positive for cocaine and methamphetamine which supports this.  He is having nausea, vomiting, and diarrhea likely from his opiate withdrawal.  Patient denies any chest pain at this time.  Labs were sent which did not show any abnormalities aside from elevated hemoglobin likely due to hemoconcentration.  Patient was given several antiemetics as well as benzodiazepines with only mild improvement of his symptoms.  Did also give the patient Suboxone for his opiate withdrawals which mildly improved his nausea and vomiting but he still appears very anxious, tachycardic, hypertensive and started to develop a fever which likely is due to the sympathomimetics.  Additional benzodiazepines were given.  He was then signed out to Dr. Pilar Plate for further management and final disposition.  Amount and/or Complexity of Data Reviewed Labs: ordered.  Risk Prescription drug management.     Final Clinical Impression(s) / ED Diagnoses Final diagnoses:  Opioid withdrawal (HCC)  Adverse effect of  sympathomimetics, initial encounter  Nausea and vomiting, unspecified vomiting type    Rx / DC Orders ED Discharge Orders     None         Rondel Baton, MD 12/18/21 [phone]

## 2021-12-17 NOTE — ED Notes (Signed)
Security Radio producer and security has secured it with them in safe

## 2021-12-17 NOTE — BH Assessment (Signed)
Per Electa Sniff, RN, the pt will not be able to answer any questions at the moment, "he's out of it." Clinician asked the RN to let her know when the pt is able to engage in the assessment.    Redmond Pulling, MS, [facility], [facility] Triage Specialist [phone]

## 2021-12-17 NOTE — ED Notes (Signed)
Pt continues to be restless and taking off all the electrodes and equipment. Pt states "he can't have anything touching him for periods of time. I am monitoring him and doing vitals as they come up. Pt is not aggressive.

## 2021-12-18 ENCOUNTER — Emergency Department (HOSPITAL_COMMUNITY): Payer: Self-pay

## 2021-12-18 ENCOUNTER — Ambulatory Visit (HOSPITAL_COMMUNITY)
Admission: EM | Admit: 2021-12-18 | Discharge: 2021-12-18 | Disposition: A | Discharge status: Other Acute Inpatient Hospital | Payer: No Payment, Other | Attending: Behavioral Health | Admitting: Behavioral Health

## 2021-12-18 ENCOUNTER — Inpatient Hospital Stay (HOSPITAL_COMMUNITY): Payer: Self-pay

## 2021-12-18 ENCOUNTER — Inpatient Hospital Stay (HOSPITAL_COMMUNITY)
Admission: EM | Admit: 2021-12-18 | Discharge: 2021-12-23 | DRG: 896 | Disposition: A | Discharge status: Other Acute Inpatient Hospital | Payer: Self-pay | Attending: Internal Medicine | Admitting: Internal Medicine

## 2021-12-18 DIAGNOSIS — F19951 Other psychoactive substance use, unspecified with psychoactive substance-induced psychotic disorder with hallucinations: Secondary | ICD-10-CM | POA: Diagnosis not present

## 2021-12-18 DIAGNOSIS — M6282 Rhabdomyolysis: Secondary | ICD-10-CM | POA: Diagnosis present

## 2021-12-18 DIAGNOSIS — F1510 Other stimulant abuse, uncomplicated: Secondary | ICD-10-CM | POA: Insufficient documentation

## 2021-12-18 DIAGNOSIS — G929 Unspecified toxic encephalopathy: Secondary | ICD-10-CM | POA: Diagnosis present

## 2021-12-18 DIAGNOSIS — R4182 Altered mental status, unspecified: Principal | ICD-10-CM

## 2021-12-18 DIAGNOSIS — E119 Type 2 diabetes mellitus without complications: Secondary | ICD-10-CM | POA: Diagnosis present

## 2021-12-18 DIAGNOSIS — J690 Pneumonitis due to inhalation of food and vomit: Secondary | ICD-10-CM

## 2021-12-18 DIAGNOSIS — I77810 Thoracic aortic ectasia: Secondary | ICD-10-CM | POA: Diagnosis present

## 2021-12-18 DIAGNOSIS — I10 Essential (primary) hypertension: Secondary | ICD-10-CM | POA: Diagnosis present

## 2021-12-18 DIAGNOSIS — F1113 Opioid abuse with withdrawal: Principal | ICD-10-CM | POA: Diagnosis present

## 2021-12-18 DIAGNOSIS — Z6836 Body mass index (BMI) 36.0-36.9, adult: Secondary | ICD-10-CM

## 2021-12-18 DIAGNOSIS — Z560 Unemployment, unspecified: Secondary | ICD-10-CM | POA: Insufficient documentation

## 2021-12-18 DIAGNOSIS — G4700 Insomnia, unspecified: Secondary | ICD-10-CM | POA: Diagnosis present

## 2021-12-18 DIAGNOSIS — F1910 Other psychoactive substance abuse, uncomplicated: Secondary | ICD-10-CM

## 2021-12-18 DIAGNOSIS — G928 Other toxic encephalopathy: Secondary | ICD-10-CM | POA: Diagnosis present

## 2021-12-18 DIAGNOSIS — J9601 Acute respiratory failure with hypoxia: Secondary | ICD-10-CM | POA: Diagnosis present

## 2021-12-18 DIAGNOSIS — R339 Retention of urine, unspecified: Secondary | ICD-10-CM | POA: Diagnosis present

## 2021-12-18 DIAGNOSIS — J189 Pneumonia, unspecified organism: Secondary | ICD-10-CM | POA: Diagnosis present

## 2021-12-18 DIAGNOSIS — G9340 Encephalopathy, unspecified: Secondary | ICD-10-CM

## 2021-12-18 DIAGNOSIS — N170 Acute kidney failure with tubular necrosis: Secondary | ICD-10-CM | POA: Diagnosis present

## 2021-12-18 DIAGNOSIS — E876 Hypokalemia: Secondary | ICD-10-CM | POA: Diagnosis present

## 2021-12-18 DIAGNOSIS — N179 Acute kidney failure, unspecified: Secondary | ICD-10-CM | POA: Diagnosis present

## 2021-12-18 DIAGNOSIS — Z9151 Personal history of suicidal behavior: Secondary | ICD-10-CM | POA: Insufficient documentation

## 2021-12-18 DIAGNOSIS — F17210 Nicotine dependence, cigarettes, uncomplicated: Secondary | ICD-10-CM | POA: Diagnosis present

## 2021-12-18 DIAGNOSIS — Z20822 Contact with and (suspected) exposure to covid-19: Secondary | ICD-10-CM | POA: Insufficient documentation

## 2021-12-18 DIAGNOSIS — F1513 Other stimulant abuse with withdrawal: Secondary | ICD-10-CM | POA: Diagnosis present

## 2021-12-18 DIAGNOSIS — F1410 Cocaine abuse, uncomplicated: Secondary | ICD-10-CM | POA: Diagnosis present

## 2021-12-18 DIAGNOSIS — R45851 Suicidal ideations: Secondary | ICD-10-CM | POA: Insufficient documentation

## 2021-12-18 DIAGNOSIS — F32A Depression, unspecified: Secondary | ICD-10-CM | POA: Insufficient documentation

## 2021-12-18 DIAGNOSIS — J13 Pneumonia due to Streptococcus pneumoniae: Secondary | ICD-10-CM | POA: Diagnosis present

## 2021-12-18 DIAGNOSIS — Z5900 Homelessness unspecified: Secondary | ICD-10-CM | POA: Insufficient documentation

## 2021-12-18 DIAGNOSIS — R7303 Prediabetes: Secondary | ICD-10-CM | POA: Diagnosis present

## 2021-12-18 DIAGNOSIS — F1193 Opioid use, unspecified with withdrawal: Secondary | ICD-10-CM

## 2021-12-18 LAB — I-STAT ARTERIAL BLOOD GAS, ED
Acid-base deficit: 2 mmol/L (ref 0.0–2.0)
Bicarbonate: 22.3 mmol/L (ref 20.0–28.0)
Calcium, Ion: 1.22 mmol/L (ref 1.15–1.40)
HCT: 51 % (ref 39.0–52.0)
Hemoglobin: 17.3 g/dL — ABNORMAL HIGH (ref 13.0–17.0)
O2 Saturation: 94 %
Patient temperature: 100.2
Potassium: 3.4 mmol/L — ABNORMAL LOW (ref 3.5–5.1)
Sodium: 143 mmol/L (ref 135–145)
TCO2: 23 mmol/L (ref 22–32)
pCO2 arterial: 39.5 mmHg (ref 32–48)
pH, Arterial: 7.364 (ref 7.35–7.45)
pO2, Arterial: 78 mmHg — ABNORMAL LOW (ref 83–108)

## 2021-12-18 LAB — LIPID PANEL
Cholesterol: 186 mg/dL (ref 0–200)
HDL: 49 mg/dL (ref >40)
LDL Cholesterol: 119 mg/dL — ABNORMAL HIGH (ref 0–99)
Total CHOL/HDL Ratio: 3.8 RATIO
Triglycerides: 89 mg/dL (ref <150)
VLDL: 18 mg/dL (ref 0–40)

## 2021-12-18 LAB — URINALYSIS, ROUTINE W REFLEX MICROSCOPIC
Bilirubin Urine: NEGATIVE
Glucose, UA: NEGATIVE mg/dL
Ketones, ur: 5 mg/dL — AB
Leukocytes,Ua: NEGATIVE
Nitrite: NEGATIVE
Protein, ur: NEGATIVE mg/dL
Specific Gravity, Urine: 1.013 (ref 1.005–1.030)
pH: 6 (ref 5.0–8.0)

## 2021-12-18 LAB — RAPID URINE DRUG SCREEN, HOSP PERFORMED
Amphetamines: POSITIVE — AB
Barbiturates: NOT DETECTED
Benzodiazepines: POSITIVE — AB
Cocaine: NOT DETECTED
Opiates: NOT DETECTED
Tetrahydrocannabinol: NOT DETECTED

## 2021-12-18 LAB — COMPREHENSIVE METABOLIC PANEL
ALT: 37 U/L (ref 0–44)
AST: 69 U/L — ABNORMAL HIGH (ref 15–41)
Albumin: 4.7 g/dL (ref 3.5–5.0)
Alkaline Phosphatase: 81 U/L (ref 38–126)
Anion gap: 15 (ref 5–15)
BUN: 13 mg/dL (ref 6–20)
CO2: 24 mmol/L (ref 22–32)
Calcium: 9.9 mg/dL (ref 8.9–10.3)
Chloride: 104 mmol/L (ref 98–111)
Creatinine, Ser: 1.39 mg/dL — ABNORMAL HIGH (ref 0.61–1.24)
GFR, Estimated: >60 mL/min (ref >60)
Glucose, Bld: 101 mg/dL — ABNORMAL HIGH (ref 70–99)
Potassium: 3.7 mmol/L (ref 3.5–5.1)
Sodium: 143 mmol/L (ref 135–145)
Total Bilirubin: 1.6 mg/dL — ABNORMAL HIGH (ref 0.3–1.2)
Total Protein: 7.7 g/dL (ref 6.5–8.1)

## 2021-12-18 LAB — MAGNESIUM
Magnesium: 1.9 mg/dL (ref 1.7–2.4)
Magnesium: 2.1 mg/dL (ref 1.7–2.4)

## 2021-12-18 LAB — VOLATILES,BLD-ACETONE,ETHANOL,ISOPROP,METHANOL
Acetone, blood: <0.01 g/dL (ref 0.000–0.010)
Ethanol, blood: <0.01 g/dL (ref 0.000–0.010)
Isopropanol, blood: <0.01 g/dL (ref 0.000–0.010)
Methanol, blood: <0.01 g/dL (ref 0.000–0.010)

## 2021-12-18 LAB — ETHANOL: Alcohol, Ethyl (B): <10 mg/dL (ref <10)

## 2021-12-18 LAB — RESP PANEL BY RT-PCR (FLU A&B, COVID) ARPGX2
Influenza A by PCR: NEGATIVE
Influenza B by PCR: NEGATIVE
SARS Coronavirus 2 by RT PCR: NEGATIVE

## 2021-12-18 LAB — CBC WITH DIFFERENTIAL/PLATELET
Abs Immature Granulocytes: 0.06 10*3/uL (ref 0.00–0.07)
Basophils Absolute: 0 10*3/uL (ref 0.0–0.1)
Basophils Relative: 0 %
Eosinophils Absolute: 0 10*3/uL (ref 0.0–0.5)
Eosinophils Relative: 0 %
HCT: 56.3 % — ABNORMAL HIGH (ref 39.0–52.0)
Hemoglobin: 17.5 g/dL — ABNORMAL HIGH (ref 13.0–17.0)
Immature Granulocytes: 0 %
Lymphocytes Relative: 19 %
Lymphs Abs: 3.2 10*3/uL (ref 0.7–4.0)
MCH: 22.5 pg — ABNORMAL LOW (ref 26.0–34.0)
MCHC: 31.1 g/dL (ref 30.0–36.0)
MCV: 72.5 fL — ABNORMAL LOW (ref 80.0–100.0)
Monocytes Absolute: 1.6 10*3/uL — ABNORMAL HIGH (ref 0.1–1.0)
Monocytes Relative: 9 %
Neutro Abs: 12.2 10*3/uL — ABNORMAL HIGH (ref 1.7–7.7)
Neutrophils Relative %: 72 %
Platelets: 201 10*3/uL (ref 150–400)
RBC: 7.77 MIL/uL — ABNORMAL HIGH (ref 4.22–5.81)
RDW: 17.2 % — ABNORMAL HIGH (ref 11.5–15.5)
WBC: 17.2 10*3/uL — ABNORMAL HIGH (ref 4.0–10.5)
nRBC: 0 % (ref 0.0–0.2)

## 2021-12-18 LAB — BASIC METABOLIC PANEL
Anion gap: 11 (ref 5–15)
BUN: 15 mg/dL (ref 6–20)
CO2: 26 mmol/L (ref 22–32)
Calcium: 9.9 mg/dL (ref 8.9–10.3)
Chloride: 104 mmol/L (ref 98–111)
Creatinine, Ser: 1.2 mg/dL (ref 0.61–1.24)
GFR, Estimated: >60 mL/min (ref >60)
Glucose, Bld: 109 mg/dL — ABNORMAL HIGH (ref 70–99)
Potassium: 3.9 mmol/L (ref 3.5–5.1)
Sodium: 141 mmol/L (ref 135–145)

## 2021-12-18 LAB — TSH: TSH: 0.33 u[IU]/mL — ABNORMAL LOW (ref 0.350–4.500)

## 2021-12-18 LAB — HEMOGLOBIN A1C
Hgb A1c MFr Bld: 6.3 % — ABNORMAL HIGH (ref 4.8–5.6)
Mean Plasma Glucose: 134.11 mg/dL

## 2021-12-18 LAB — CK
Total CK: 1557 U/L — ABNORMAL HIGH (ref 49–397)
Total CK: 2402 U/L — ABNORMAL HIGH (ref 49–397)

## 2021-12-18 LAB — ACETAMINOPHEN LEVEL: Acetaminophen (Tylenol), Serum: <10 ug/mL — ABNORMAL LOW (ref 10–30)

## 2021-12-18 LAB — GLUCOSE, CAPILLARY
Glucose-Capillary: 101 mg/dL — ABNORMAL HIGH (ref 70–99)
Glucose-Capillary: 62 mg/dL — ABNORMAL LOW (ref 70–99)

## 2021-12-18 LAB — SALICYLATE LEVEL: Salicylate Lvl: <7 mg/dL — ABNORMAL LOW (ref 7.0–30.0)

## 2021-12-18 LAB — CBG MONITORING, ED: Glucose-Capillary: 147 mg/dL — ABNORMAL HIGH (ref 70–99)

## 2021-12-18 LAB — HIV ANTIBODY (ROUTINE TESTING W REFLEX): HIV Screen 4th Generation wRfx: NONREACTIVE

## 2021-12-18 LAB — PHOSPHORUS: Phosphorus: 3.2 mg/dL (ref 2.5–4.6)

## 2021-12-18 LAB — OSMOLALITY: Osmolality: 308 mOsm/kg — ABNORMAL HIGH (ref 275–295)

## 2021-12-18 LAB — POC SARS CORONAVIRUS 2 AG -  ED: SARS Coronavirus 2 Ag: NEGATIVE

## 2021-12-18 MED ORDER — DOCUSATE SODIUM 50 MG/5ML PO LIQD: 100.0000 mg | Freq: Two times a day (BID) | ORAL | Status: DC | PRN

## 2021-12-18 MED ORDER — LOPERAMIDE HCL 2 MG PO CAPS: 2.0000 mg | ORAL_CAPSULE | ORAL | Status: DC | PRN

## 2021-12-18 MED ORDER — PANTOPRAZOLE 2 MG/ML SUSPENSION
40.0000 mg | Freq: Every day | ORAL | Status: DC
  Administered 2021-12-18 – 2021-12-20 (×3): 40 mg
  Filled 2021-12-18 (×4): qty 20

## 2021-12-18 MED ORDER — MIDAZOLAM HCL 2 MG/2ML IJ SOLN
2.0000 mg | Freq: Once | INTRAMUSCULAR | Status: AC
  Administered 2021-12-18: 2 mg via INTRAVENOUS
  Filled 2021-12-18: qty 2

## 2021-12-18 MED ORDER — SODIUM CHLORIDE 0.9 % IV SOLN
3.0000 g | Freq: Four times a day (QID) | INTRAVENOUS | Status: DC
  Administered 2021-12-18 – 2021-12-20 (×8): 3 g via INTRAVENOUS
  Filled 2021-12-18 (×8): qty 8

## 2021-12-18 MED ORDER — POLYETHYLENE GLYCOL 3350 17 G PO PACK
17.0000 g | PACK | Freq: Every day | ORAL | Status: DC
  Administered 2021-12-18 – 2021-12-20 (×3): 17 g
  Filled 2021-12-18 (×4): qty 1

## 2021-12-18 MED ORDER — MIDAZOLAM-SODIUM CHLORIDE 100-0.9 MG/100ML-% IV SOLN
0.0000 mg/h | INTRAVENOUS | Status: DC
  Administered 2021-12-18 – 2021-12-20 (×5): 10 mg/h via INTRAVENOUS
  Filled 2021-12-18 (×5): qty 100

## 2021-12-18 MED ORDER — POLYETHYLENE GLYCOL 3350 17 G PO PACK: 17.0000 g | PACK | Freq: Every day | ORAL | Status: DC | PRN

## 2021-12-18 MED ORDER — NAPROXEN 500 MG PO TABS: 500.0000 mg | ORAL_TABLET | Freq: Two times a day (BID) | ORAL | Status: DC | PRN

## 2021-12-18 MED ORDER — MIDAZOLAM-SODIUM CHLORIDE 100-0.9 MG/100ML-% IV SOLN
INTRAVENOUS | Status: AC
  Administered 2021-12-18: 10 mg/h via INTRAVENOUS
  Filled 2021-12-18: qty 100

## 2021-12-18 MED ORDER — ORAL CARE MOUTH RINSE: 15.0000 mL | OROMUCOSAL | Status: DC

## 2021-12-18 MED ORDER — DICYCLOMINE HCL 20 MG PO TABS: 20.0000 mg | ORAL_TABLET | Freq: Four times a day (QID) | ORAL | Status: DC | PRN

## 2021-12-18 MED ORDER — CLONIDINE HCL 0.1 MG PO TABS: 0.1000 mg | ORAL_TABLET | ORAL | Status: DC

## 2021-12-18 MED ORDER — MIDAZOLAM HCL (PF) 5 MG/ML IJ SOLN
5.0000 mg | Freq: Once | INTRAMUSCULAR | Status: AC
  Administered 2021-12-18: 5 mg via INTRAMUSCULAR
  Filled 2021-12-18: qty 1

## 2021-12-18 MED ORDER — FENTANYL CITRATE (PF) 100 MCG/2ML IJ SOLN
INTRAMUSCULAR | Status: DC | PRN
  Administered 2021-12-18: 200 ug via INTRAVENOUS

## 2021-12-18 MED ORDER — FENTANYL CITRATE PF 50 MCG/ML IJ SOSY
PREFILLED_SYRINGE | INTRAMUSCULAR | Status: AC
  Filled 2021-12-18: qty 4

## 2021-12-18 MED ORDER — SODIUM CHLORIDE 0.9 % IV BOLUS
1000.0000 mL | Freq: Once | INTRAVENOUS | Status: AC
  Administered 2021-12-18: 1000 mL via INTRAVENOUS

## 2021-12-18 MED ORDER — MIDAZOLAM HCL 2 MG/2ML IJ SOLN
INTRAMUSCULAR | Status: DC | PRN
  Administered 2021-12-18 (×2): 4 mg via INTRAVENOUS

## 2021-12-18 MED ORDER — DEXMEDETOMIDINE HCL IN NACL 400 MCG/100ML IV SOLN
0.4000 ug/kg/h | INTRAVENOUS | Status: DC
  Administered 2021-12-18: 0.4 ug/kg/h via INTRAVENOUS
  Filled 2021-12-18 (×2): qty 100

## 2021-12-18 MED ORDER — HYDROXYZINE HCL 25 MG PO TABS: 25.0000 mg | ORAL_TABLET | Freq: Three times a day (TID) | ORAL | Status: DC | PRN

## 2021-12-18 MED ORDER — SODIUM CHLORIDE 0.9 % IV SOLN
INTRAVENOUS | Status: DC | PRN
  Administered 2021-12-18: 999 mL/h via INTRAVENOUS

## 2021-12-18 MED ORDER — ROCURONIUM BROMIDE 10 MG/ML (PF) SYRINGE
PREFILLED_SYRINGE | INTRAVENOUS | Status: AC
  Filled 2021-12-18: qty 10

## 2021-12-18 MED ORDER — MAGNESIUM HYDROXIDE 400 MG/5ML PO SUSP: 30.0000 mL | Freq: Every day | ORAL | Status: DC | PRN

## 2021-12-18 MED ORDER — OLANZAPINE 5 MG PO TBDP
5.0000 mg | ORAL_TABLET | Freq: Two times a day (BID) | ORAL | Status: DC
  Administered 2021-12-18: 5 mg via ORAL
  Filled 2021-12-18: qty 1

## 2021-12-18 MED ORDER — LACTATED RINGERS IV SOLN
INTRAVENOUS | Status: DC | PRN
  Administered 2021-12-18: 1000 mL/h via INTRAVENOUS

## 2021-12-18 MED ORDER — ROCURONIUM BROMIDE 10 MG/ML (PF) SYRINGE
PREFILLED_SYRINGE | INTRAVENOUS | Status: AC
  Administered 2021-12-18: 100 mg via INTRAVENOUS
  Filled 2021-12-18: qty 10

## 2021-12-18 MED ORDER — CLONIDINE HCL 0.1 MG PO TABS: 0.1000 mg | ORAL_TABLET | Freq: Every day | ORAL | Status: DC

## 2021-12-18 MED ORDER — ACETAMINOPHEN 325 MG PO TABS: 650.0000 mg | ORAL_TABLET | Freq: Four times a day (QID) | ORAL | Status: DC | PRN

## 2021-12-18 MED ORDER — CLONIDINE HCL 0.1 MG PO TABS
0.1000 mg | ORAL_TABLET | Freq: Four times a day (QID) | ORAL | Status: DC
  Administered 2021-12-18: 0.1 mg via ORAL
  Filled 2021-12-18: qty 1

## 2021-12-18 MED ORDER — PANTOPRAZOLE 2 MG/ML SUSPENSION: 40.0000 mg | Freq: Every day | ORAL | Status: DC

## 2021-12-18 MED ORDER — LACTATED RINGERS IV SOLN: INTRAVENOUS | Status: DC

## 2021-12-18 MED ORDER — DOCUSATE SODIUM 50 MG/5ML PO LIQD
100.0000 mg | Freq: Two times a day (BID) | ORAL | Status: DC
  Administered 2021-12-18 – 2021-12-20 (×5): 100 mg
  Filled 2021-12-18 (×6): qty 10

## 2021-12-18 MED ORDER — ROCURONIUM BROMIDE 50 MG/5ML IV SOLN
INTRAVENOUS | Status: DC | PRN
  Administered 2021-12-18: 150 mg via INTRAVENOUS

## 2021-12-18 MED ORDER — METHOCARBAMOL 500 MG PO TABS
500.0000 mg | ORAL_TABLET | Freq: Three times a day (TID) | ORAL | Status: DC | PRN
  Administered 2021-12-18: 500 mg via ORAL
  Filled 2021-12-18: qty 1

## 2021-12-18 MED ORDER — FENTANYL 2500MCG IN NS 250ML (10MCG/ML) PREMIX INFUSION
0.0000 ug/h | INTRAVENOUS | Status: DC
  Administered 2021-12-18 – 2021-12-19 (×3): 300 ug/h via INTRAVENOUS
  Administered 2021-12-19: 400 ug/h via INTRAVENOUS
  Administered 2021-12-20: 300 ug/h via INTRAVENOUS
  Administered 2021-12-20: 400 ug/h via INTRAVENOUS
  Filled 2021-12-18 (×6): qty 250

## 2021-12-18 MED ORDER — ROCURONIUM BROMIDE 50 MG/5ML IV SOLN
100.0000 mg | INTRAVENOUS | Status: AC
  Filled 2021-12-18: qty 10

## 2021-12-18 MED ORDER — ORAL CARE MOUTH RINSE
15.0000 mL | OROMUCOSAL | Status: DC
  Administered 2021-12-18 – 2021-12-20 (×19): 15 mL via OROMUCOSAL

## 2021-12-18 MED ORDER — DOCUSATE SODIUM 100 MG PO CAPS: 100.0000 mg | ORAL_CAPSULE | Freq: Two times a day (BID) | ORAL | Status: DC | PRN

## 2021-12-18 MED ORDER — ETOMIDATE 2 MG/ML IV SOLN: 20.0000 mg | INTRAVENOUS | Status: AC

## 2021-12-18 MED ORDER — ETOMIDATE 2 MG/ML IV SOLN
INTRAVENOUS | Status: AC
  Administered 2021-12-18: 20 mg via INTRAVENOUS
  Filled 2021-12-18: qty 10

## 2021-12-18 MED ORDER — TRAZODONE HCL 50 MG PO TABS: 50.0000 mg | ORAL_TABLET | Freq: Every evening | ORAL | Status: DC | PRN

## 2021-12-18 MED ORDER — FENTANYL BOLUS VIA INFUSION
50.0000 ug | INTRAVENOUS | Status: DC | PRN
  Administered 2021-12-19: 100 ug via INTRAVENOUS

## 2021-12-18 MED ORDER — LORAZEPAM 1 MG PO TABS
1.0000 mg | ORAL_TABLET | Freq: Four times a day (QID) | ORAL | Status: DC | PRN
  Administered 2021-12-18: 1 mg via ORAL
  Filled 2021-12-18: qty 1

## 2021-12-18 MED ORDER — MIDAZOLAM BOLUS VIA INFUSION
0.0000 mg | INTRAVENOUS | Status: DC | PRN
  Administered 2021-12-19: 2 mg via INTRAVENOUS
  Administered 2021-12-19: 1 mg via INTRAVENOUS

## 2021-12-18 MED ORDER — ONDANSETRON 4 MG PO TBDP: 4.0000 mg | ORAL_TABLET | Freq: Four times a day (QID) | ORAL | Status: DC | PRN

## 2021-12-18 MED ORDER — ALUM & MAG HYDROXIDE-SIMETH 200-200-20 MG/5ML PO SUSP: 30.0000 mL | ORAL | Status: DC | PRN

## 2021-12-18 MED ORDER — FENTANYL 2500MCG IN NS 250ML (10MCG/ML) PREMIX INFUSION
INTRAVENOUS | Status: AC
  Administered 2021-12-18: 250 ug/h via INTRAVENOUS
  Filled 2021-12-18: qty 250

## 2021-12-18 MED ORDER — ORAL CARE MOUTH RINSE
15.0000 mL | OROMUCOSAL | Status: DC | PRN
  Administered 2021-12-18: 15 mL via OROMUCOSAL

## 2021-12-18 NOTE — ED Notes (Signed)
Writer went to provider with request for in-person visit due to change in pt status. Pt diaphoretic, fidgety, extreme jerking movements, unsteady with ambulation, difficult formulating sentences, unable to obtain BP due to fidgety and jerking movements. Manual BP 160/94, HR 58, 02 sats 89 with deep breathing. Called EMS to transport pt to [facility], report given to [place], CN.
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
## 2021-12-18 NOTE — ED Provider Notes (Signed)
Per nursing, pt is not feeling well, gait unsteady, pt is jerking and sweating profusely. Patient seen and evaluated by this provider. On approach, patient is sitting down, alert and oriented to person place and time. He is diaphoretic. He appears to be responding to external stimuli AEB by continuously looking around the room and reports seeing things. He is noted to have upper extremity jerking. His current vitals are O2 is 89, and heart rate 58. Dinamap unable to detect b/p. Nurse currently obtaining manual b/p and respiration. B/p 160/94. Resp 18. Patient denies SOB, dizziness or chest pains. Nurse advise to send patient to ED for medical clearance via EMS. Report given to Dr. Estell Harpin at [facility]. Patient may return back to the [facility] once medically stable.   Patient was seen on 12/17/21 at [facility] and medically cleared:  Patient endorsing opioid withdrawal but we also expect sympathomimetic toxidrome, currently not tolerating p.o., awaiting symptomatic management, repeat labs, will reassess.   Patient is sitting up in bed, no acute distress, no toxidrome and features at this time, CK mildly elevated at 1500 with no acute kidney injury, advised repeat check with PCP or here in the emergency department within the next week or so.  Appropriate for discharge.

## 2021-12-18 NOTE — Progress Notes (Signed)
   12/18/21 0742  [facility] Triage Screening (Walk-ins at [facility] only)  How Did You Hear About [place]? Family/Friend  What Is the Reason for Your Visit/Call Today? Pt is a 31 yo male who presented voluntarily accompanied by his mother, Philbert Riser Little, due to drug :addiction" and "seeing things." Pt endorsed SI with thoughts of cutting his throat with a knife. Pt stated that taking drugs keeps him from attempting suicide. Pt denied any past psychiatric admissions. Pt reported that he uses heroin and methamphetamine daily and has for the last 3 years. Pt stated he has not used either in the last 24-48 hours. Pt denied HI, Pt reported a hx of burning himself but stated he has not burned himself in about 2 months. Pt reported "seeing people talkign to me and telling me to kill myself." Pt reporteding feleing that "everyone" including strangers were constantly watching him and follwing him.  How Long Has This Been Causing You Problems? > than 6 months  Have You Recently Had Any Thoughts About Hurting Yourself? Yes  How long ago did you have thoughts about hurting yourself? currently  Are You Planning to Commit Suicide/Harm Yourself At This time? Yes  Have you Recently Had Thoughts About Hurting Someone Karolee Ohs? No  Are You Planning To Harm Someone At This Time? No  Are you currently experiencing any auditory, visual or other hallucinations? Yes  Please explain the hallucinations you are currently experiencing: hearing people talking to him telling him to kill himself  Have You Used Any Alcohol or Drugs in the Past 24 Hours? No (denies use in last 24-48 hours)  Clinician description of patient physical appearance/behavior: Pt seemed alert and can answer question appropriately. Pt seems about to fall asleep/drowsy and then, suddenly having involuntary body movements (twitches). Pt did not make eye contact very often instead looking down most of the assessment. Pt spoke in a quiet, quick manner. Pt was calm but restless and  seemed as if he were getting increasingly agitated at times.  What Do You Feel Would Help You the Most Today? Alcohol or Drug Use Treatment;Housing Assistance;Food Assistance;Medication(s)  If access to [facility] was not available, would you have sought care in the Emergency Department? Yes (pt was seen at [facility] yesterday)  Determination of Need Urgent (48 hours)   Theora Gianotti. Jimmye Norman, MS, [facility], [facility] Triage Specialist [facility]

## 2021-12-18 NOTE — ED Notes (Signed)
STAT lab courier called to transport labs to [facility] lab 
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
## 2021-12-18 NOTE — Procedures (Signed)
Central Venous Catheter Insertion Procedure Note  Jimmy Jarvis  [id number]  [birth year]  Date:12/18/21  Time:11:47 PM   Provider Performing:Chauntel Windsor   Procedure: Insertion of Non-tunneled Central Venous [phone]) with US guidance (19622)   Indication(s) Medication administration  Consent Risks of the procedure as well as the alternatives and risks of each were explained to the patient and/or caregiver.  Consent for the procedure was obtained and is signed in the bedside chart  Anesthesia Topical only with 1% lidocaine   Timeout Verified patient identification, verified procedure, site/side was marked, verified correct patient position, special equipment/implants available, medications/allergies/relevant history reviewed, required imaging and test results available.  Sterile Technique Maximal sterile technique including full sterile barrier drape, hand hygiene, sterile gown, sterile gloves, mask, hair covering, sterile ultrasound probe cover (if used).  Procedure Description Area of catheter insertion was cleaned with chlorhexidine and draped in sterile fashion.  With real-time ultrasound guidance a central venous catheter was placed into the left internal jugular vein. Nonpulsatile blood flow and easy flushing noted in all ports.  The catheter was sutured in place and sterile dressing applied.  Complications/Tolerance None; patient tolerated the procedure well. Chest X-ray is ordered to verify placement for internal jugular or subclavian cannulation.   Chest x-ray is not ordered for femoral cannulation.  EBL Minimal  Specimen(s) None  Coralyn Helling, MD [facility] Pulmonary/Critical Care Pager - [phone] 12/18/2021, 11:48 PM

## 2021-12-18 NOTE — ED Notes (Signed)
CCM @ bedside, verbal order to d/c precedex at this time.

## 2021-12-18 NOTE — ED Notes (Signed)
Pt approached the nurses station stating, "I need something for my nerves. I'm getting agitated. Pt received Ativan 1mg  po willingly. Informed pt to utilize some coping skills to manage agitation (deep breathing). Pt verbalized agreement. Will continue to monitor for safety.

## 2021-12-18 NOTE — BH Assessment (Signed)
Comprehensive Clinical Assessment (CCA) Note  12/18/2021 Jimmy Jarvis [id number]  DISPOSITION: Per Liborio Nixon NP pt ie recommended for continuous observation and re-assessment.   The patient demonstrates the following risk factors for suicide: Chronic risk factors for suicide include: psychiatric disorder of MDD and substance use disorder. Acute risk factors for suicide include: unemployment, social withdrawal/isolation, and loss (financial, interpersonal, professional). Protective factors for this patient include: positive social support and hope for the future. Considering these factors, the overall suicide risk at this point appears to be high. Patient is appropriate for outpatient follow up.  Flowsheet Row ED from 12/18/2021 in [facility] ED from 12/17/2021 in [facility]  C-SSRS RISK CATEGORY High Risk No Risk      Pt is a 31 yo male who presented voluntarily accompanied by his mother, Jimmy Jarvis, due to drug "addiction" and "seeing things." Mother, Jimmy Jarvis, was present and participated with pt's verbal permission. Pt endorsed SI with thoughts of cutting his throat with a knife. Pt stated that taking drugs keeps him from attempting suicide. Pt denied any past psychiatric admissions. Pt reported that he uses heroin and methamphetamine daily and has for the last 3 years. Pt stated he has not used either in the last 24-48 hours. Pt denied HI, Pt reported a hx of burning himself but stated he has not burned himself in about 2 months. Pt reported "seeing people talking to me and telling me to kill myself." Pt reporting feeling that "everyone" including strangers were constantly watching him and following him. Yesterday, pt was brought to [facility] via EMS for Opioid withdrawal but once there, would not cooperate when TTS attempted to assess him for treatment recommendations. Pt then left the ED. Pt denied any past psychiatric  admissions.   Pt denied any OP psychiatric providers or any substance use/abuse treatment. Pt stated he was homeless and unemployed. Pt stated he has been never married and has 1- 37 yo son who does not live with him. Pt denied any current legal issues, access to guns/weapons and any hx of abuse/trauma. Pt reported that he "has not slept in days" and "has not eaten in 4-5 days."   Pt seemed alert and can answer question appropriately. Pt seems about to fall asleep/drowsy and then, suddenly having involuntary body movements (twitches). Pt did not make eye contact very often instead looking down most of the assessment. Pt spoke in a quiet, quick manner. Pt was calm but restless and seemed as if he were getting increasingly agitated at times.   Chief Complaint:  Chief Complaint  Patient presents with   Suicidal   Addiction Problem   Visit Diagnosis:  MDD, Recurrent, Severe Stimulant Use d/o, Amphetamines Opioid Use d/o    CCA Screening, Triage and Referral (STR)  Patient Reported Information How did you hear about [place]? Family/Friend  What Is the Reason for Your Visit/Call Today? Pt is a 31 yo male who presented voluntarily accompanied by his mother, Jimmy Jarvis, due to drug :addiction" and "seeing things." Pt endorsed SI with thoughts of cutting his throat with a knife. Pt stated that taking drugs keeps him from attempting suicide. Pt denied any past psychiatric admissions. Pt reported that he uses heroin and methamphetamine daily and has for the last 3 years. Pt stated he has not used either in the last 24-48 hours. Pt denied HI, Pt reported a hx of burning himself but stated he has not burned himself in about 2 months.  Pt reported "seeing people talkign to me and telling me to kill myself." Pt reporteding feleing that "everyone" including strangers were constantly watching him and follwing him.  How Long Has This Been Causing You Problems? > than 6 months  What Do You Feel Would Help You  the Most Today? Alcohol or Drug Use Treatment; Housing Assistance; Food Assistance; Medication(s)   Have You Recently Had Any Thoughts About Hurting Yourself? Yes  Are You Planning to Commit Suicide/Harm Yourself At This time? Yes   Have you Recently Had Thoughts About Hurting Someone Karolee Ohs? No  Are You Planning to Harm Someone at This Time? No  Explanation: No data recorded  Have You Used Any Alcohol or Drugs in the Past 24 Hours? No (denies use in last 24-48 hours)  How Long Ago Did You Use Drugs or Alcohol? No data recorded What Did You Use and How Much? No data recorded  Do You Currently Have a Therapist/Psychiatrist? No  Name of Therapist/Psychiatrist: No data recorded  Have You Been Recently Discharged From Any Office Practice or Programs? Yes  Explanation of Discharge From Practice/Program: [facility] 12/17/21     CCA Screening Triage Referral Assessment Type of Contact: Face-to-Face  Telemedicine Service Delivery:   Is this Initial or Reassessment? No data recorded Date Telepsych consult ordered in [facility]:  No data recorded Time Telepsych consult ordered in [facility]:  No data recorded Location of Assessment: [facility] Assessment Services  Provider Location: [facility] Assessment Services   Collateral Involvement: Mother, Jimmy Jarvis, was present and participated with pt's verbal permission.   Does Patient Have a Automotive engineer Guardian? No data recorded Name and Contact of Legal Guardian: No data recorded If Minor and Not Living with Parent(s), Who has Custody? No data recorded Is CPS involved or ever been involved? -- (uta)  Is APS involved or ever been involved? -- Rich Reining)   Patient Determined To Be At Risk for Harm To Self or Others Based on Review of Patient Reported Information or Presenting Complaint? Yes, for Self-Harm  Method: No data recorded Availability of Means: No data recorded Intent: No data recorded Notification Required: No data recorded Additional  Information for Danger to Others Potential: No data recorded Additional Comments for Danger to Others Potential: No data recorded Are There Guns or Other Weapons in Your Home? No data recorded Types of Guns/Weapons: No data recorded Are These Weapons Safely Secured?                            No data recorded Who Could Verify You Are Able To Have These Secured: No data recorded Do You Have any Outstanding Charges, Pending Court Dates, Parole/Probation? No data recorded Contacted To Inform of Risk of Harm To Self or Others: No data recorded   Does Patient Present under Involuntary Commitment? No  IVC Papers Initial File Date: No data recorded  [place] of Residence: [place]   Patient Currently Receiving the Following Services: No data recorded  Determination of Need: Urgent (48 hours) (Per Liborio Nixon NP pt ie recommended for continuous observation and re-assessment.)   Options For Referral: Other: Comment ([facility] OBS unit)     CCA Biopsychosocial Patient Reported Schizophrenia/Schizoaffective Diagnosis in Past: No (denied)   Strengths: uta   Mental Health Symptoms Depression:   Change in energy/activity; Difficulty Concentrating; Fatigue; Hopelessness; Irritability; Worthlessness   Duration of Depressive symptoms:  Duration of Depressive Symptoms: Greater than two weeks   Mania:  None   Anxiety:    None   Psychosis:   Delusions; Hallucinations   Duration of Psychotic symptoms:  Duration of Psychotic Symptoms: Greater than six months   Trauma:   None   Obsessions:   None   Compulsions:   None   Inattention:   N/A   Hyperactivity/Impulsivity:   N/A   Oppositional/Defiant Behaviors:   N/A   Emotional Irregularity:   Mood lability; Potentially harmful impulsivity; Recurrent suicidal behaviors/gestures/threats   Other Mood/Personality Symptoms:   uta    Mental Status Exam Appearance and self-care  Stature:   Average   Weight:    Overweight   Clothing:   Dirty; Disheveled   Grooming:   Neglected   Cosmetic use:   None   Posture/gait:   Slumped   Motor activity:   Restless; Slowed; Agitated   Sensorium  Attention:   Distractible   Concentration:   Scattered   Orientation:   X5   Recall/memory:   Normal   Affect and Mood  Affect:   Depressed; Flat   Mood:   Depressed; Dysphoric; Worthless; Hopeless   Relating  Eye contact:   Avoided   Facial expression:   Depressed   Attitude toward examiner:   Cooperative; Guarded; Irritable; Suspicious   Thought and Language  Speech flow:  Clear and Coherent; Paucity; Slow; Soft   Thought content:   Delusions; Persecutions   Preoccupation:   None   Hallucinations:   Auditory; Visual; Command (Comment) (to kill himself)   Organization:  No data recorded  [employer] of Knowledge:   -- Rich Reining)   Intelligence:  No data recorded  Abstraction:   Functional   Judgement:   Impaired   Reality Testing:   Distorted   Insight:   Lacking   Decision Making:   Impulsive   Social Functioning  Social Maturity:   Impulsive   Social Judgement:   Heedless   Stress  Stressors:   Family conflict; Housing; Surveyor, quantity; Relationship; Other (Comment) (drug use)   Coping Ability:   Overwhelmed; Exhausted   Skill Deficits:   -- Rich Reining)   Supports:   Family; Support needed     Religion: Religion/Spirituality Are You A Religious Person?:  Rich Reining)  Leisure/Recreation: Leisure / Recreation Do You Have Hobbies?:  Rich Reining)  Exercise/Diet: Exercise/Diet Do You Exercise?:  (uta) Have You Gained or Lost A Significant Amount of Weight in the Past Six Months?:  (uta) Do You Follow a Special Diet?: No Do You Have Any Trouble Sleeping?: Yes Explanation of Sleeping Difficulties: homelessness complicates   CCA Employment/Education Employment/Work Situation: Employment / Work Situation Employment Situation: Unemployed Has  Patient ever Been in Equities trader?: No  Education: Education Is Patient Currently Attending School?: No Last Grade Completed:  (GED) Did You Product manager?: No   CCA Family/Childhood History Family and Relationship History: Family history Does patient have children?: Yes How many children?: 1 (60 yo son)  Childhood History:  Childhood History By whom was/is the patient raised?: Mother Did patient suffer any verbal/emotional/physical/sexual abuse as a child?: No Did patient suffer from severe childhood neglect?: No Has patient ever been sexually abused/assaulted/raped as an adolescent or adult?: No  Child/Adolescent Assessment:     CCA Substance Use Alcohol/Drug Use: Alcohol / Drug Use Pain Medications: see MAR Prescriptions: see MAR Over the Counter: see MAR History of alcohol / drug use?: Yes Substance #1 Name of Substance 1: opioids 1 - Age of First Use: uta 1 - Amount (  size/oz): uta 1 - Frequency: daily 1 - Duration: ongoing 1 - Last Use / Amount: 2 days ago 1 - Method of Aquiring: uta 1- Route of Use: uta Substance #2 Name of Substance 2: "ice" - crystal methamphetamine 2 - Age of First Use: uta 2 - Amount (size/oz): uta 2 - Frequency: daily 2 - Duration: ongoing 2 - Last Use / Amount: 1 day ago 2 - Method of Aquiring: uta 2 - Route of Substance Use: uta                     ASAM's:  Six Dimensions of Multidimensional Assessment  Dimension 1:  Acute Intoxication and/or Withdrawal Potential:      Dimension 2:  Biomedical Conditions and Complications:      Dimension 3:  Emotional, Behavioral, or Cognitive Conditions and Complications:     Dimension 4:  Readiness to Change:     Dimension 5:  Relapse, Continued use, or Continued Problem Potential:     Dimension 6:  Recovery/Living Environment:     ASAM Severity Score:    ASAM Recommended Level of Treatment:     Substance use Disorder (SUD)    Recommendations for  Services/Supports/Treatments:    Discharge Disposition:    DSM5 Diagnoses: There are no problems to display for this patient.    Referrals to Alternative Service(s): Referred to Alternative Service(s):   Place:   Date:   Time:    Referred to Alternative Service(s):   Place:   Date:   Time:    Referred to Alternative Service(s):   Place:   Date:   Time:    Referred to Alternative Service(s):   Place:   Date:   Time:     Carolanne Grumbling, Counselor  Corrie Dandy T. Jimmye Norman, MS, [facility], [facility] Triage Specialist [facility]

## 2021-12-18 NOTE — Progress Notes (Signed)
ETT retracted 3cm per MD order. ETT is now placed 23@ lip.

## 2021-12-18 NOTE — Discharge Instructions (Addendum)
Transfer to the ED for medical clearance

## 2021-12-18 NOTE — ED Notes (Signed)
Call placed to pt's mother Philbert Riser to inform her pt will be transferred to [facility] ed.  Pt requested nursing staff let mother know where he will be going.

## 2021-12-18 NOTE — ED Notes (Signed)
Patient placed on soft restraints.

## 2021-12-18 NOTE — ED Notes (Signed)
MHT reported pt had brief episode of apnea while getting EKG done.  Pulse ox checked pt was sating at 100% room air with a pulse of 52.  Alert and oriented x4.  Provider notified.

## 2021-12-18 NOTE — ED Notes (Signed)
Philbert Riser Little Mother [phone] requesting an update on the patient

## 2021-12-18 NOTE — ED Notes (Signed)
Patient placed on mittens.

## 2021-12-18 NOTE — ED Notes (Signed)
Pt transferred to [facility] via [facility].

## 2021-12-18 NOTE — Procedures (Signed)
Bronchoscopy Procedure Note  Jimmy Jarvis  [id number]  [birth year]  Date:12/18/21  Time:6:53 PM   Provider Performing:Emonte Dieujuste   Procedure(s):  Flexible bronchoscopy with bronchial alveolar lavage (68341)  Indication(s) Aspiration pneumonitis  Consent Unable to obtain consent due to emergent nature of procedure.  Anesthesia Etomidate and Rocuronium   Time Out Verified patient identification, verified procedure, site/side was marked, verified correct patient position, special equipment/implants available, medications/allergies/relevant history reviewed, required imaging and test results available.   Sterile Technique Usual hand hygiene, masks, gowns, and gloves were used   Procedure Description Bronchoscope advanced through endotracheal tube and into airway.  Airways were examined down to subsegmental level with findings noted below.   Following diagnostic evaluation, BAL(s) performed in LLL with normal saline and return of Yellowish fluid  Findings: Copious amounts of thin secretion noted throughout respiratory tree   Complications/Tolerance None; patient tolerated the procedure well. Chest X-ray is not needed post procedure.   EBL Minimal   Specimen(s) BAL

## 2021-12-18 NOTE — Progress Notes (Signed)
Mother, Philbert Riser [phone] updated on events.  She reports her son confessed everything last night to her as she was previously unaware> currently homeless, has been using drugs for the last 2 years- meth, heroin, fentanyl but expressed he wanted help to stop.  Mother was with him from discharge from ER to [facility] and does not think he could have taken anything.   Ask to speak to someone to help him get medicaid established.  TOC consult placed.

## 2021-12-18 NOTE — ED Provider Notes (Signed)
  Physical Exam  BP (!) 122/59   Pulse (!) 115   Temp 100.2 F (37.9 C) (Axillary)   Resp 15   Ht 5\' 10"  (1.778 m)   Wt 121 kg   SpO2 100%   BMI 38.28 kg/m   Physical Exam  Procedures  .Critical Care  Performed by: , MD Authorized by: Mardene Sayer, MD   Critical care provider statement:    Critical care time (minutes):  30   Critical care was necessary to treat or prevent imminent or life-threatening deterioration of the following conditions:  Toxidrome   Critical care was time spent personally by me on the following activities:  Development of treatment plan with patient or surrogate, discussions with consultants, evaluation of patient's response to treatment, examination of patient, ordering and review of laboratory studies, ordering and review of radiographic studies, ordering and performing treatments and interventions, pulse oximetry, re-evaluation of patient's condition, review of old charts and obtaining history from patient or surrogate   I assumed direction of critical care for this patient from another provider in my specialty: yes     Care discussed with: admitting provider     ED Course / MDM   Clinical Course as of 12/18/21 1632  Fri Dec 18, 2021  1627 ETT withdrawn to 26 @ lip [SG]    Clinical Course User Index [SG] Dec 20, 2021, DO   Medical Decision Making Amount and/or Complexity of Data Reviewed Labs: ordered. Radiology: ordered. ECG/medicine tests: ordered.  Risk Prescription drug management. Decision regarding hospitalization.   4:33 PM Patient intubated for agitated delirium, unable to control with Versed and Precedex drip.  CK20 400.  Glucose 147.  Salicylate and acetaminophen undetected.  Creatinine 1.39.  CT head imaging pending.  Chest x-ray and abdominal x-ray pending.  Discussed case with Dr. Sloan Leiter intensivist who will be down to evaluate patient plan orders for admission.       Manon Hilding,  MD 12/18/21 [phone]

## 2021-12-18 NOTE — Progress Notes (Signed)
eLink Physician-Brief Progress Note Patient Name: Jimmy Jarvis DOB: 04-26-1991 MRN: [id number]   Date of Service  12/18/2021  HPI/Events of Note  Limited IV access. Patient receiving Versed and Fentanyl through 1 PIV IV team unable to find access  eICU Interventions  Ground team notified to assess for CVC access     Intervention Category Intermediate Interventions: Other:  [name] 12/18/2021, 10:37 PM

## 2021-12-18 NOTE — Progress Notes (Signed)
Pt was transported to CT via vent with no complication. Pt is now back in room 25 with no apparent complications.

## 2021-12-18 NOTE — Progress Notes (Signed)
Pt arrived to unit vitals stable, mother updated. Dr Merrily Pew at bedside to perform bronch

## 2021-12-18 NOTE — Discharge Instructions (Addendum)
You were evaluated in the Emergency Department and after careful evaluation, we did not find any emergent condition requiring admission or further testing in the hospital.  Your exam/testing today was overall reassuring.  Important that you stop using illicit drugs and follow-up closely with your primary care doctor.  Also recommend following up with the outpatient substance use resources.  Please return to the Emergency Department if you experience any worsening of your condition.  Thank you for allowing [place] to be a part of your care.

## 2021-12-18 NOTE — Progress Notes (Signed)
Pt was transported to 332M with no apparent complications. 332M RT given report.
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
## 2021-12-18 NOTE — ED Notes (Signed)
Observed pt jerking/shaking while asleep. Writer went to wake pt up and inform him to put his shirt on. Pt sweating and involuntary jerking motions noted. Pt states, "I get like this when I'm coming off this stuff. I need a muscle relaxant". Provider notified of pt sx and request. Will continue to monitor.

## 2021-12-18 NOTE — Progress Notes (Signed)
This VAST RN at bedside to assess for PIV placement. Bilateral arms assessed using ultrasound.  Left forearm: only one very small vein noted, unsuccessful attempt made to place IV using [place]. Right forearm: also only one small noted, again very small, unsuccessful attempt made to place IV using [place].  Neither upper arm have visible cephalic vein, left brachial noted to get smaller the more proximal it extends so not suitable for PICC.  At this time, CVC recommended for further access needs. Primary RN notified.

## 2021-12-18 NOTE — ED Triage Notes (Signed)
Pt arriving with [facility] from [facility]. Pt arrived at [facility] this morning for detox from heroin. Pt has been exhibiting involuntary movements for the past hour and is diaphoretic. EMS gave 2.5 versed PTA. At [facility] pt received valium, suboxone, 1 mg ativan.

## 2021-12-18 NOTE — Progress Notes (Signed)
Pharmacy Antibiotic Note  Jimmy Jarvis is a 31 y.o. male admitted on 12/18/2021 presenting from [facility] with involuntary movements, agitation, concern for aspiration s/p intubation.  Pharmacy has been consulted for Unasyn dosing.  Plan: Unasyn 3g IV every 6 hours Monitor renal function, LOT  Height: 5\' 10"  (177.8 cm) Weight: 121 kg (266 lb 12.1 oz) IBW/kg (Calculated) : 73  Temp (24hrs), Avg:99.6 F (37.6 C), Min:99.1 F (37.3 C), Max:100.2 F (37.9 C)  Recent Labs  Lab 12/17/21 1635 12/18/21 0045 12/18/21 1415  WBC 9.4  --  17.2*  CREATININE 1.18 1.20 1.39*    Estimated Creatinine Clearance: 101.3 mL/min (A) (by C-G formula based on SCr of 1.39 mg/dL (H)).    No Known Allergies  02/17/22, PharmD Clinical Pharmacist ED Pharmacist Phone # [phone] 12/18/2021 5:40 PM

## 2021-12-18 NOTE — ED Notes (Signed)
Pt admitted to observation unit endorsing SI w/plan to cut throat due to addiction to opioids. Pt fidgetty, endorsing CAH to kill himself and voices "belittling" him. Able to complete assessment. Pt took medications to help with addiction and agitation without difficulty. Cooperative with staff. Oriented to unit and unit rules. Will monitor for safety.

## 2021-12-19 ENCOUNTER — Inpatient Hospital Stay (HOSPITAL_COMMUNITY): Payer: Self-pay

## 2021-12-19 DIAGNOSIS — J9601 Acute respiratory failure with hypoxia: Secondary | ICD-10-CM

## 2021-12-19 DIAGNOSIS — I161 Hypertensive emergency: Secondary | ICD-10-CM

## 2021-12-19 LAB — CBC
HCT: 46.1 % (ref 39.0–52.0)
HCT: 49.3 % (ref 39.0–52.0)
Hemoglobin: 14.6 g/dL (ref 13.0–17.0)
Hemoglobin: 15.6 g/dL (ref 13.0–17.0)
MCH: 22.7 pg — ABNORMAL LOW (ref 26.0–34.0)
MCH: 22.7 pg — ABNORMAL LOW (ref 26.0–34.0)
MCHC: 31.7 g/dL (ref 30.0–36.0)
MCHC: 31.6 g/dL (ref 30.0–36.0)
MCV: 71.6 fL — ABNORMAL LOW (ref 80.0–100.0)
MCV: 71.7 fL — ABNORMAL LOW (ref 80.0–100.0)
Platelets: 221 10*3/uL (ref 150–400)
Platelets: 237 10*3/uL (ref 150–400)
RBC: 6.44 MIL/uL — ABNORMAL HIGH (ref 4.22–5.81)
RBC: 6.88 MIL/uL — ABNORMAL HIGH (ref 4.22–5.81)
RDW: 15.1 % (ref 11.5–15.5)
RDW: 15.9 % — ABNORMAL HIGH (ref 11.5–15.5)
WBC: 10.7 10*3/uL — ABNORMAL HIGH (ref 4.0–10.5)
WBC: 9.2 10*3/uL (ref 4.0–10.5)
nRBC: 0 % (ref 0.0–0.2)
nRBC: 0 % (ref 0.0–0.2)

## 2021-12-19 LAB — GLUCOSE, CAPILLARY
Glucose-Capillary: 53 mg/dL — ABNORMAL LOW (ref 70–99)
Glucose-Capillary: 71 mg/dL (ref 70–99)
Glucose-Capillary: 83 mg/dL (ref 70–99)
Glucose-Capillary: 87 mg/dL (ref 70–99)
Glucose-Capillary: 87 mg/dL (ref 70–99)
Glucose-Capillary: 88 mg/dL (ref 70–99)
Glucose-Capillary: 96 mg/dL (ref 70–99)
Glucose-Capillary: 96 mg/dL (ref 70–99)

## 2021-12-19 LAB — BASIC METABOLIC PANEL
Anion gap: 7 (ref 5–15)
BUN: 15 mg/dL (ref 6–20)
CO2: 25 mmol/L (ref 22–32)
Calcium: 8.8 mg/dL — ABNORMAL LOW (ref 8.9–10.3)
Chloride: 111 mmol/L (ref 98–111)
Creatinine, Ser: 1.26 mg/dL — ABNORMAL HIGH (ref 0.61–1.24)
GFR, Estimated: >60 mL/min (ref >60)
Glucose, Bld: 91 mg/dL (ref 70–99)
Potassium: 3.4 mmol/L — ABNORMAL LOW (ref 3.5–5.1)
Sodium: 143 mmol/L (ref 135–145)

## 2021-12-19 LAB — CREATININE, SERUM
Creatinine, Ser: 1.34 mg/dL — ABNORMAL HIGH (ref 0.61–1.24)
GFR, Estimated: >60 mL/min (ref >60)

## 2021-12-19 LAB — MAGNESIUM
Magnesium: 2 mg/dL (ref 1.7–2.4)
Magnesium: 2 mg/dL (ref 1.7–2.4)

## 2021-12-19 LAB — PHOSPHORUS
Phosphorus: 4 mg/dL (ref 2.5–4.6)
Phosphorus: 4.7 mg/dL — ABNORMAL HIGH (ref 2.5–4.6)

## 2021-12-19 LAB — CK: Total CK: 1582 U/L — ABNORMAL HIGH (ref 49–397)

## 2021-12-19 MED ORDER — BETHANECHOL CHLORIDE 10 MG PO TABS
10.0000 mg | ORAL_TABLET | Freq: Two times a day (BID) | ORAL | Status: DC
Start: 2021-12-19 — End: 2021-12-21
  Administered 2021-12-19 – 2021-12-21 (×5): 10 mg
  Filled 2021-12-19 (×5): qty 1

## 2021-12-19 MED ORDER — AMLODIPINE BESYLATE 5 MG PO TABS
5.0000 mg | ORAL_TABLET | Freq: Every day | ORAL | Status: DC
  Administered 2021-12-19 – 2021-12-21 (×3): 5 mg
  Filled 2021-12-19 (×3): qty 1

## 2021-12-19 MED ORDER — SODIUM CHLORIDE 0.9% FLUSH
10.0000 mL | Freq: Two times a day (BID) | INTRAVENOUS | Status: DC
  Administered 2021-12-19 – 2021-12-22 (×8): 10 mL

## 2021-12-19 MED ORDER — SODIUM CHLORIDE 0.9 % IV SOLN: INTRAVENOUS | Status: DC | PRN

## 2021-12-19 MED ORDER — CLONIDINE HCL 0.1 MG PO TABS
0.1000 mg | ORAL_TABLET | Freq: Three times a day (TID) | ORAL | Status: DC
  Administered 2021-12-19 – 2021-12-20 (×3): 0.1 mg
  Filled 2021-12-19 (×3): qty 1

## 2021-12-19 MED ORDER — CHLORHEXIDINE GLUCONATE CLOTH 2 % EX PADS
6.0000 | MEDICATED_PAD | Freq: Every day | CUTANEOUS | Status: DC
  Administered 2021-12-19 – 2021-12-22 (×4): 6 via TOPICAL

## 2021-12-19 MED ORDER — DEXTROSE 50 % IV SOLN
INTRAVENOUS | Status: AC
  Filled 2021-12-19: qty 50

## 2021-12-19 MED ORDER — ENOXAPARIN SODIUM 60 MG/0.6ML IJ SOSY
55.0000 mg | PREFILLED_SYRINGE | INTRAMUSCULAR | Status: DC
  Administered 2021-12-19 – 2021-12-22 (×4): 55 mg via SUBCUTANEOUS
  Filled 2021-12-19 (×4): qty 0.6

## 2021-12-19 MED ORDER — VITAL HIGH PROTEIN PO LIQD: 1000.0000 mL | ORAL | Status: DC

## 2021-12-19 MED ORDER — VITAL HIGH PROTEIN PO LIQD
1000.0000 mL | ORAL | Status: DC
  Administered 2021-12-19: 1000 mL

## 2021-12-19 MED ORDER — SODIUM CHLORIDE 0.9% FLUSH: 10.0000 mL | INTRAVENOUS | Status: DC | PRN

## 2021-12-19 MED ORDER — PROSOURCE TF PO LIQD
90.0000 mL | Freq: Three times a day (TID) | ORAL | Status: DC
  Administered 2021-12-19 – 2021-12-20 (×2): 90 mL
  Filled 2021-12-19 (×2): qty 90

## 2021-12-19 MED ORDER — PROSOURCE TF PO LIQD
45.0000 mL | Freq: Two times a day (BID) | ORAL | Status: DC
Start: 2021-12-19 — End: 2021-12-19
  Administered 2021-12-19: 45 mL
  Filled 2021-12-19: qty 45

## 2021-12-19 MED ORDER — ENOXAPARIN SODIUM 40 MG/0.4ML IJ SOSY: 40.0000 mg | PREFILLED_SYRINGE | INTRAMUSCULAR | Status: DC

## 2021-12-19 MED ORDER — POTASSIUM CHLORIDE 20 MEQ PO PACK
40.0000 meq | PACK | Freq: Once | ORAL | Status: AC
  Administered 2021-12-19: 40 meq
  Filled 2021-12-19: qty 2

## 2021-12-19 MED ORDER — HYDRALAZINE HCL 20 MG/ML IJ SOLN
10.0000 mg | Freq: Four times a day (QID) | INTRAMUSCULAR | Status: DC | PRN
Start: 2021-12-19 — End: 2021-12-20

## 2021-12-19 MED ORDER — HYDRALAZINE HCL 20 MG/ML IJ SOLN
10.0000 mg | Freq: Four times a day (QID) | INTRAMUSCULAR | Status: DC | PRN
Start: 2021-12-19 — End: 2021-12-19

## 2021-12-19 MED ORDER — HEPARIN SODIUM (PORCINE) 5000 UNIT/ML IJ SOLN
5000.0000 [IU] | Freq: Three times a day (TID) | INTRAMUSCULAR | Status: DC
  Administered 2021-12-19: 5000 [IU] via SUBCUTANEOUS
  Filled 2021-12-19: qty 1

## 2021-12-19 NOTE — Progress Notes (Signed)
eLink Physician-Brief Progress Note Patient Name: INA POUPARD DOB: 1990-08-02 MRN: [id number]   Date of Service  12/19/2021  HPI/Events of Note  Patient hypertensive with ranging from SBP 150-180s and DBP 100-110s  eICU Interventions  PRN hydralazine ordered        Ivonna Kinnick Mechele Collin 12/19/2021, 2:06 AM

## 2021-12-19 NOTE — H&P (Signed)
NAME:  Jimmy Jarvis, MRN:  [id number], DOB:  03-22-1991, LOS: 1 ADMISSION DATE:  12/18/2021, CONSULTATION DATE:  12/18/21 REFERRING MD:  Dr. Elpidio Anis, CHIEF COMPLAINT:  agitation   History of Present Illness:  HPI obtained from medical chart review as patient is intubated and sedated on MV.   31 year old male with prior history of DM, obesity, and polysubstance abuse who presented from [facility] with agitated delirium.  Seen in ER on 8/3 for concerns of withdrawal from heroin and meth/ ICE with N/V/ mild diarrhea.  At that time he smoked/snorted heroin 8/2, denied IV heroin, and "ICE" believed to be meth on 8/3 and denied any SI intentions.  He was treated and stable for discharge home later that evening.    Returned to ER 8/4 by EMS from [facility] after he presented there for detox from heroin, noted to be having involuntary movements and diaphoretic.  Given valium, suboxone, and valium at [facility] then sent to ER via EMS in which he was given versed without improvement.  In ER, temp 100.2, he remains tachycardic, hypertensive, and progressively agitated, combative, confused, and diaphoretic despite cumulative total of versed 35mg , followed by maxed out precedex drip which did not help, therefore to maintain safety, he was sedated and intubated and placed on fentanyl and versed drips.  Labs notable for K 3.7, sCr 1.39 (1.2 yesterday), AST 69, CK 2402 (1557 yesterday), serum osmolality 308, WBC 17.2, Hgb 17.5, HCT 56.3, Negative ETOH, salicylate.  Treated with 3L IVF.  CXR initially showed right mainstem intubation, since retracted.  PCCM called for admit.   Pertinent  Medical History  DM, obesity, polysubstance abuse- heroin, meth/ ICE  Significant Hospital Events: Including procedures, antibiotic start and stop dates in addition to other pertinent events   8/4 admitted, intubated  Interim History / Subjective:  Blood pressure remains elevated Patient came intermittently agitated and trying to get out of  bed He is afebrile  Objective   Blood pressure (!) 160/112, pulse 80, temperature 98.3 F (36.8 C), temperature source Axillary, resp. rate 15, height 5\' 10"  (1.778 m), weight 114.9 kg, SpO2 100 %.    Vent Mode: PRVC FiO2 (%):  [40 %-100 %] 40 % Set Rate:  [15 bmp] 15 bmp Vt Set:  [580 mL] 580 mL PEEP:  [5 cmH20] 5 cmH20 Plateau Pressure:  [16 cmH20-17 cmH20] 16 cmH20   Intake/Output Summary (Last 24 hours) at 12/19/2021 1122 Last data filed at 12/19/2021 0800 Gross per 24 hour  Intake 8239.81 ml  Output 1500 ml  Net 6739.81 ml   Filed Weights   12/18/21 1400 12/18/21 1928  Weight: 121 kg 114.9 kg   Examination:   Physical exam: General: Crtitically ill-appearing obese male, orally intubated HEENT: [place]/AT, eyes anicteric.  ETT and OGT in place Neuro: Sedated, not following commands.  Eyes are closed.  Pupils 3 mm bilateral reactive to light Chest: Coarse breath sounds, no wheezes or rhonchi Heart: Regular rate and rhythm, no murmurs or gallops Abdomen: Soft, nontender, nondistended, bowel sounds present Skin: No rash  Resolved Hospital Problem list    Assessment & Plan:  Acute toxic/ metabolic encephalopathy/ concerning for withdrawal vs sympathomimetic toxicity (related to meth/ ICE +/- cocaine) Polysubstance abuse - heroin, cocaine, meth Minimize sedation with RASS goal -2 Continue Versed and fentanyl infusion Head CT is negative for acute findings UDS is positive for heroin, cocaine and meth No beta-blocker in the setting of cocaine abuse  Acute respiratory failure with hypoxia suspected Aspiration  pneumonia vs pneumonitis  Continue lung protective ventilation Not ready for SBT yet due to agitated delirium VAP prevention protocol/ PPI PAD protocol for sedation> fentanyl/ versed Wean FiO2 as able for SpO2 >92%  Continue IV Unasyn Follow-up respiratory culture  AKI due to toxic ATN Elevated CK r/o rhabdomyolysis Continue IV fluid Serum creatinine is trending  down as well as CK is trending down Monitor intake and output Avoid nephrotoxic agents  DM, controlled A1c 6.3 Continue sliding scale insulin with CBG goal 140-180 Resume tube feeds  Obesity Dietitian follow-up  Acute urinary retention Start bethanechol  Hypokalemia Continue aggressive electrolyte supplement  Best Practice (right click and "Reselect all SmartList Selections" daily)   Diet/type: Start tube feeds DVT prophylaxis: Subcu Lovenox GI prophylaxis: PPI Lines: Central line, yes needed Foley:  N/A Code Status:  full code Last date of multidisciplinary goals of care discussion [8/4.  Patient mother was updated]   Labs   CBC: Recent Labs  Lab 12/17/21 1635 12/18/21 1415 12/18/21 1759 12/19/21 0335  WBC 9.4 17.2*  --  10.7*  NEUTROABS  --  12.2*  --   --   HGB 17.4* 17.5* 17.3* 14.6  HCT 56.1* 56.3* 51.0 46.1  MCV 72.0* 72.5*  --  71.6*  PLT 329 201  --  221    Basic Metabolic Panel: Recent Labs  Lab 12/17/21 1635 12/18/21 0045 12/18/21 1415 12/18/21 1759 12/18/21 1853 12/19/21 0335  NA 141 141 143 143  --  143  K 3.5 3.9 3.7 3.4*  --  3.4*  CL 104 104 104  --   --  111  CO2 25 26 24   --   --  25  GLUCOSE 104* 109* 101*  --   --  91  BUN 14 15 13   --   --  15  CREATININE 1.18 1.20 1.39*  --   --  1.26*  CALCIUM 10.3 9.9 9.9  --   --  8.8*  MG 2.1 1.9  --   --  2.1  --   PHOS  --  3.2  --   --   --   --    GFR: Estimated Creatinine Clearance: 108.9 mL/min (A) (by C-G formula based on SCr of 1.26 mg/dL (H)). Recent Labs  Lab 12/17/21 1635 12/18/21 1415 12/19/21 0335  WBC 9.4 17.2* 10.7*    Liver Function Tests: Recent Labs  Lab 12/17/21 1635 12/18/21 1415  AST 36 69*  ALT 24 37  ALKPHOS 91 81  BILITOT 1.2 1.6*  PROT 9.2* 7.7  ALBUMIN 4.9 4.7   No results for input(s): "LIPASE", "AMYLASE" in the last 168 hours. No results for input(s): "AMMONIA" in the last 168 hours.  ABG    Component Value Date/Time   PHART 7.364  12/18/2021 1759   PCO2ART 39.5 12/18/2021 1759   PO2ART 78 (L) 12/18/2021 1759   HCO3 22.3 12/18/2021 1759   TCO2 23 12/18/2021 1759   ACIDBASEDEF 2.0 12/18/2021 1759   O2SAT 94 12/18/2021 1759     Coagulation Profile: No results for input(s): "INR", "PROTIME" in the last 168 hours.  Cardiac Enzymes: Recent Labs  Lab 12/18/21 0045 12/18/21 1415 12/19/21 0335  CKTOTAL 1,557* 2,402* 1,582*    HbA1C: Hgb A1c MFr Bld  Date/Time Value Ref Range Status  12/18/2021 08:52 AM 6.3 (H) 4.8 - 5.6 % Final    Comment:    (NOTE) Pre diabetes:          5.7%-6.4%  Diabetes:              >  6.4%  Glycemic control for   <7.0% adults with diabetes     CBG: Recent Labs  Lab 12/18/21 2355 12/19/21 0022 12/19/21 0320 12/19/21 0328 12/19/21 0730  GLUCAP 62* 96 53* 88 71      Total critical care time: 37 minutes  Performed by: Cheri Fowler   Critical care time was exclusive of separately billable procedures and treating other patients.   Critical care was necessary to treat or prevent imminent or life-threatening deterioration.   Critical care was time spent personally by me on the following activities: development of treatment plan with patient and/or surrogate as well as nursing, discussions with consultants, evaluation of patient's response to treatment, examination of patient, obtaining history from patient or surrogate, ordering and performing treatments and interventions, ordering and review of laboratory studies, ordering and review of radiographic studies, pulse oximetry and re-evaluation of patient's condition.   Cheri Fowler, MD [facility] See Amion for pager If no response to pager, please call [phone] until 7pm After 7pm, Please call E-link [phone]

## 2021-12-19 NOTE — Progress Notes (Signed)
Initial Nutrition Assessment  DOCUMENTATION CODES:   Obesity unspecified  INTERVENTION:   -Initiate TF via OGT:  Vital High Protein @ 45 ml/hr (1080 ml daily)  90 ml Prosource TF TID.    Tube feeding regimen provides 1320 kcals, 160 grams of protein, and 903 ml of H2O.    NUTRITION DIAGNOSIS:   Inadequate oral intake related to inability to eat as evidenced by NPO status.  GOAL:   Provide needs based on ASPEN/SCCM guidelines  MONITOR:   Vent status, TF tolerance  REASON FOR ASSESSMENT:   Consult Assessment of nutrition requirement/status  ASSESSMENT:   Pt with prior history of DM, obesity, and polysubstance abuse who presented with agitated delirium.  Pt admitted with acute toxic/ metabolic encephalopathy concerning for withdrawal (meth, ice, cocaine).   Patient is currently intubated on ventilator support. OGT placement verified in the stomach by x-ray. MV: 8.2 L/min Temp (24hrs), Avg:98.4 F (36.9 C), Min:98.2 F (36.8 C), Max:98.7 F (37.1 C)  MAP: 73  Reviewed I/O's: +6.6 L x 24 hours  UOP:1.1 L x 24 hours  NGT output: 400 ml x 24 hours   Pt with history of polysubstance abuse (heroin, meth, and cocaine).   Case discussed with PCCM; received permission to start TF.   No wt hx available to assess at this time.   Medications reviewed and include colace, miralax, and versed.   Labs reviewed: K:3.4, CBGS: 83-87.    Diet Order:   Diet Order             Diet NPO time specified  Diet effective now                   EDUCATION NEEDS:   No education needs have been identified at this time  Skin:  Skin Assessment: Reviewed RN Assessment  Last BM:  Unknown  Height:   Ht Readings from Last 1 Encounters:  12/18/21 5\' 10"  (1.778 m)    Weight:   Wt Readings from Last 1 Encounters:  12/18/21 114.9 kg   BMI:  Body mass index is 36.35 kg/m.  Estimated Nutritional Needs:   Kcal:  02/17/22  Protein:  > 151 grams  Fluid:  > 1.3  L    [id number], RD, LDN, CDCES Registered Dietitian II Certified Diabetes Care and Education Specialist Please refer to [facility] for RD and/or RD on-call/weekend/after hours pager

## 2021-12-19 NOTE — Progress Notes (Signed)
[facility] ADULT ICU REPLACEMENT PROTOCOL   The patient does apply for the [facility] Adult ICU Electrolyte Replacment Protocol based on the criteria listed below:   1.Exclusion criteria: TCTS patients, ECMO patients, and Dialysis patients 2. Is GFR >/= 30 ml/min? Yes.    Patient's GFR today is >60 3. Is SCr </= 2? Yes.   Patient's SCr is 1.26 mg/dL 4. Did SCr increase >/= 0.5 in 24 hours? No. 5.Pt's weight >40kg  Yes.   6. Abnormal electrolyte(s): K 3.4  7. Electrolytes replaced per protocol 8.  Call MD STAT for K+ </= 2.5, Phos </= 1, or Mag </= 1 Physician:    Markus Daft A 12/19/2021 6:04 AM

## 2021-12-19 NOTE — Progress Notes (Signed)
eLink Physician-Brief Progress Note Patient Name: Jimmy Jarvis DOB: 1990-12-02 MRN: [id number]   Date of Service  12/19/2021  HPI/Events of Note  Patient as confusing enteral nutrition orders.  eICU Interventions  Orders clarified with bedside RN.        Thomasene Lot Havish Petties 12/19/2021, 7:52 PM

## 2021-12-20 DIAGNOSIS — J154 Pneumonia due to other streptococci: Secondary | ICD-10-CM

## 2021-12-20 LAB — GLUCOSE, CAPILLARY
Glucose-Capillary: 79 mg/dL (ref 70–99)
Glucose-Capillary: 98 mg/dL (ref 70–99)
Glucose-Capillary: 103 mg/dL — ABNORMAL HIGH (ref 70–99)
Glucose-Capillary: 90 mg/dL (ref 70–99)
Glucose-Capillary: 96 mg/dL (ref 70–99)
Glucose-Capillary: 85 mg/dL (ref 70–99)

## 2021-12-20 LAB — CK: Total CK: 734 U/L — ABNORMAL HIGH (ref 49–397)

## 2021-12-20 LAB — CBC
HCT: 44.6 % (ref 39.0–52.0)
Hemoglobin: 14 g/dL (ref 13.0–17.0)
MCH: 23 pg — ABNORMAL LOW (ref 26.0–34.0)
MCHC: 31.4 g/dL (ref 30.0–36.0)
MCV: 73.4 fL — ABNORMAL LOW (ref 80.0–100.0)
Platelets: 191 10*3/uL (ref 150–400)
RBC: 6.08 MIL/uL — ABNORMAL HIGH (ref 4.22–5.81)
RDW: 15 % (ref 11.5–15.5)
WBC: 7.6 10*3/uL (ref 4.0–10.5)
nRBC: 0 % (ref 0.0–0.2)

## 2021-12-20 LAB — ECHOCARDIOGRAM COMPLETE
AR max vel: 2.82 cm2
AV Peak grad: 4.1 mmHg
Ao pk vel: 1.01 m/s
Area-P 1/2: 3.83 cm2
Height: 70 in
S' Lateral: 1.9 cm
Weight: 4052.94 oz

## 2021-12-20 LAB — BASIC METABOLIC PANEL
Anion gap: 7 (ref 5–15)
BUN: 17 mg/dL (ref 6–20)
CO2: 26 mmol/L (ref 22–32)
Calcium: 8.6 mg/dL — ABNORMAL LOW (ref 8.9–10.3)
Chloride: 109 mmol/L (ref 98–111)
Creatinine, Ser: 1.2 mg/dL (ref 0.61–1.24)
GFR, Estimated: >60 mL/min (ref >60)
Glucose, Bld: 95 mg/dL (ref 70–99)
Potassium: 3.3 mmol/L — ABNORMAL LOW (ref 3.5–5.1)
Sodium: 142 mmol/L (ref 135–145)

## 2021-12-20 LAB — PHOSPHORUS
Phosphorus: 4.8 mg/dL — ABNORMAL HIGH (ref 2.5–4.6)
Phosphorus: 4.9 mg/dL — ABNORMAL HIGH (ref 2.5–4.6)

## 2021-12-20 LAB — MAGNESIUM
Magnesium: 2.1 mg/dL (ref 1.7–2.4)
Magnesium: 2.1 mg/dL (ref 1.7–2.4)

## 2021-12-20 MED ORDER — CLONAZEPAM 0.5 MG PO TBDP
1.0000 mg | ORAL_TABLET | Freq: Two times a day (BID) | ORAL | Status: DC
  Administered 2021-12-20: 1 mg
  Filled 2021-12-20: qty 2

## 2021-12-20 MED ORDER — POTASSIUM CHLORIDE 20 MEQ PO PACK
20.0000 meq | PACK | ORAL | Status: AC
  Administered 2021-12-20 (×2): 20 meq
  Filled 2021-12-20 (×2): qty 1

## 2021-12-20 MED ORDER — POTASSIUM CHLORIDE 10 MEQ/50ML IV SOLN
10.0000 meq | INTRAVENOUS | Status: AC
  Administered 2021-12-20 (×4): 10 meq via INTRAVENOUS
  Filled 2021-12-20 (×4): qty 50

## 2021-12-20 MED ORDER — SODIUM CHLORIDE 0.9 % IV BOLUS
500.0000 mL | Freq: Once | INTRAVENOUS | Status: AC
Start: 2021-12-20 — End: 2021-12-20
  Administered 2021-12-20: 500 mL via INTRAVENOUS

## 2021-12-20 MED ORDER — SODIUM CHLORIDE 0.9 % IV SOLN
2.0000 g | INTRAVENOUS | Status: DC
  Administered 2021-12-20: 2 g via INTRAVENOUS
  Filled 2021-12-20: qty 20

## 2021-12-20 MED ORDER — MELATONIN 3 MG PO TABS
3.0000 mg | ORAL_TABLET | Freq: Once | ORAL | Status: AC
  Administered 2021-12-20: 3 mg via ORAL
  Filled 2021-12-20: qty 1

## 2021-12-20 MED ORDER — OXYCODONE HCL 5 MG PO TABS
5.0000 mg | ORAL_TABLET | ORAL | Status: DC
  Administered 2021-12-20: 5 mg
  Filled 2021-12-20: qty 1

## 2021-12-20 MED ORDER — DEXMEDETOMIDINE HCL IN NACL 400 MCG/100ML IV SOLN
0.4000 ug/kg/h | INTRAVENOUS | Status: DC
  Administered 2021-12-20: 0.4 ug/kg/h via INTRAVENOUS
  Filled 2021-12-20: qty 100

## 2021-12-20 MED ORDER — QUETIAPINE FUMARATE 50 MG PO TABS
50.0000 mg | ORAL_TABLET | Freq: Two times a day (BID) | ORAL | Status: DC
  Administered 2021-12-20: 50 mg
  Filled 2021-12-20: qty 1

## 2021-12-20 MED ORDER — ORAL CARE MOUTH RINSE: 15.0000 mL | OROMUCOSAL | Status: DC | PRN

## 2021-12-20 NOTE — Procedures (Signed)
Extubation Procedure Note  Patient Details:   Name: Jimmy Jarvis DOB: [birth year] MRN: [id number]   Airway Documentation:    Vent end date: 12/20/21 Vent end time: 1335   Evaluation  O2 sats: stable throughout Complications: No apparent complications Patient did tolerate procedure well. Bilateral Breath Sounds: Diminished   Yes  Pt extubated per MD order. Placed on 3L [place]. Positive cuff leak noted, no stridor heard. Vitals stable throughout. RT will continue to monitor.   Memory Argue 12/20/2021, 1:41 PM

## 2021-12-20 NOTE — Progress Notes (Signed)
[facility] ADULT ICU REPLACEMENT PROTOCOL   The patient does apply for the [facility] Adult ICU Electrolyte Replacment Protocol based on the criteria listed below:   1.Exclusion criteria: TCTS patients, ECMO patients, and Dialysis patients 2. Is GFR >/= 30 ml/min? Yes.    Patient's GFR today is >60 3. Is SCr </= 2? Yes.   Patient's SCr is 1.20 mg/dL 4. Did SCr increase >/= 0.5 in 24 hours? No. 5.Pt's weight >40kg  Yes.   6. Abnormal electrolyte(s): K  7. Electrolytes replaced per protocol 8.  Call MD STAT for K+ </= 2.5, Phos </= 1, or Mag </= 1 Physician:  Alwyn Pea [facility] 12/20/2021 5:34 AM

## 2021-12-20 NOTE — Progress Notes (Signed)
eLink Physician-Brief Progress Note Patient Name: Jimmy Jarvis DOB: 11/01/1990 MRN: [id number]   Date of Service  12/20/2021  HPI/Events of Note  Insomnia  eICU Interventions  Melatonin ordered     Intervention Category Minor Interventions: Other:  Ranee Gosselin 12/20/2021, 10:10 PM

## 2021-12-20 NOTE — Progress Notes (Signed)
NAME:  Jimmy Jarvis, MRN:  [id number], DOB:  02/15/1991, LOS: 2 ADMISSION DATE:  12/18/2021, CONSULTATION DATE:  12/18/21 REFERRING MD:  Dr. Nechama Guard, CHIEF COMPLAINT:  agitation   History of Present Illness:  HPI obtained from medical chart review as patient is intubated and sedated on MV.   31 year old male with prior history of DM, obesity, and polysubstance abuse who presented from [facility] with agitated delirium.  Seen in ER on 8/3 for concerns of withdrawal from heroin and meth/ ICE with N/V/ mild diarrhea.  At that time he smoked/snorted heroin 8/2, denied IV heroin, and "ICE" believed to be meth on 8/3 and denied any SI intentions.  He was treated and stable for discharge home later that evening.    Returned to ER 8/4 by EMS from [facility] after he presented there for detox from heroin, noted to be having involuntary movements and diaphoretic.  Given valium, suboxone, and valium at [facility] then sent to ER via EMS in which he was given versed without improvement.  In ER, temp 100.2, he remains tachycardic, hypertensive, and progressively agitated, combative, confused, and diaphoretic despite cumulative total of versed 14m, followed by maxed out precedex drip which did not help, therefore to maintain safety, he was sedated and intubated and placed on fentanyl and versed drips.  Labs notable for K 3.7, sCr 1.39 (1.2 yesterday), AST 69, CK 2402 (1557 yesterday), serum osmolality 308, WBC 17.2, Hgb 17.5, HCT 56.3, Negative ETOH, salicylate.  Treated with 3L IVF.  CXR initially showed right mainstem intubation, since retracted.  PCCM called for admit.   Pertinent  Medical History  DM, obesity, polysubstance abuse- heroin, meth/ ICE  Significant Hospital Events: Including procedures, antibiotic start and stop dates in addition to other pertinent events   8/4 admitted, intubated 8/5 agitation remains an issue, BP elevated, foley placed for retention  Interim History / Subjective:  No events overnight F/c  but intermittent agitated despite fentanyl 400/ versed 10  Objective   Blood pressure 100/61, pulse 82, temperature 99.1 F (37.3 C), temperature source Axillary, resp. rate 15, height _0  (1.778 m), weight 115.4 kg, SpO2 100 %.    Vent Mode: PRVC FiO2 (%):  [30 %-40 %] 30 % Set Rate:  [15 bmp] 15 bmp Vt Set:  [580 mL] 580 mL PEEP:  [5 cmH20] 5 cmH20 Plateau Pressure:  [16 cmH20-18 cmH20] 17 cmH20   Intake/Output Summary (Last 24 hours) at 12/20/2021 0729 Last data filed at 12/20/2021 0600 Gross per 24 hour  Intake 2930.84 ml  Output 1435 ml  Net 1495.84 ml   Filed Weights   12/18/21 1400 12/18/21 1928 12/20/21 0436  Weight: 121 kg 114.9 kg 115.4 kg   Examination: Fentanyl 400, versed 10 General:  Adult obese male sitting upright in bed HEENT: MM pink/moist, ETT/ OGT, pupils 4/reactive Neuro:  intermittently agitated then lethargic, MAE, follows simple commands CV: rr, NSR PULM:  non labored, coarse, minimal ETT secretions, flipped to PSV, tolerated 5/5 when awake but then low TV/ bradypnea when back lethargic  GI: soft, bs+, NT, foley- amber Extremities: warm/dry, no LE edema  Skin: no rashes  afebrile Labs reviewed> K 3.3, Mag 2.1, CK 1582> 734 UOP 1.4L/ 24hrs Net +8L   Resolved Hospital Problem list    Assessment & Plan:  Acute toxic/ metabolic encephalopathy and agitated delirium 2/2 polysubstance withdrawal  Polysubstance abuse - heroin, cocaine, meth Minimize sedation with RASS goal 0/-1 Continue Versed and fentanyl infusion, adding precedex to wean  off versed gtt/ change to prn  Clonidine added 8/5- no room in BP to titrate up  Adding seroquel, klonopin, and oxy IR today to help wean off drips  Acute respiratory failure with hypoxia suspected Aspiration pneumonia vs pneumonitis  Continue full MV support VAP/ PPI  Weans well when awake but then still having periods of agitated delirium Enteral sedation added, likely will be a rapid wean/ extubation  Follow  BAL, continue unasyn for now  AKI due to toxic ATN, resolved Elevated CK r/o rhabdomyolysis, resolving  sCr wnl, and CK trending down Good UOP Foley placed 8/5 for retention, hopeful to get out when more awake Monitor intake and output Avoid nephrotoxic agents  DM, controlled A1c 6.3 SSI not needed, CBGs 71-98  Obesity Dietitian follow-up  Acute urinary retention cont bethanechol  Hypokalemia K replete ordered, Mag ok   Best Practice (right click and "Reselect all SmartList Selections" daily)   Diet/type: tube feeds DVT prophylaxis: Subcu Lovenox GI prophylaxis: PPI Lines: Central line, yes needed Foley:  Yes, and it is still needed Code Status:  full code Last date of multidisciplinary goals of care discussion [8/4]  Pending update 8/6   Labs   CBC: Recent Labs  Lab 12/17/21 1635 12/18/21 1415 12/18/21 1759 12/19/21 0335 12/19/21 1202 12/20/21 0440  WBC 9.4 17.2*  --  10.7* 9.2 7.6  NEUTROABS  --  12.2*  --   --   --   --   HGB 17.4* 17.5* 17.3* 14.6 15.6 14.0  HCT 56.1* 56.3* 51.0 46.1 49.3 44.6  MCV 72.0* 72.5*  --  71.6* 71.7* 73.4*  PLT 329 201  --  221 237 530    Basic Metabolic Panel: Recent Labs  Lab 12/17/21 1635 12/18/21 0045 12/18/21 1415 12/18/21 1759 12/18/21 1853 12/19/21 0335 12/19/21 1126 12/19/21 1202 12/19/21 1810 12/20/21 0440  NA 141 141 143 143  --  143  --   --   --  142  K 3.5 3.9 3.7 3.4*  --  3.4*  --   --   --  3.3*  CL 104 104 104  --   --  111  --   --   --  109  CO2 _0 --   --  25  --   --   --  26  GLUCOSE 104* 109* 101*  --   --  91  --   --   --  95  BUN _1 --   --  15  --   --   --  17  CREATININE 1.18 1.20 1.39*  --   --  1.26*  --  1.34*  --  1.20  CALCIUM 10.3 9.9 9.9  --   --  8.8*  --   --   --  8.6*  MG 2.1 1.9  --   --  2.1  --  2.0  --  2.0 2.1  PHOS  --  3.2  --   --   --   --  4.0  --  4.7* 4.8*   GFR: Estimated Creatinine Clearance: 114.6 mL/min (by C-G formula based on SCr of 1.2  mg/dL). Recent Labs  Lab 12/18/21 1415 12/19/21 0335 12/19/21 1202 12/20/21 0440  WBC 17.2* 10.7* 9.2 7.6    Liver Function Tests: Recent Labs  Lab 12/17/21 1635 12/18/21 1415  AST 36 69*  ALT 24 37  ALKPHOS 91 81  BILITOT 1.2 1.6*  PROT 9.2*  7.7  ALBUMIN 4.9 4.7   No results for input(s): "LIPASE", "AMYLASE" in the last 168 hours. No results for input(s): "AMMONIA" in the last 168 hours.  ABG    Component Value Date/Time   PHART 7.364 12/18/2021 1759   PCO2ART 39.5 12/18/2021 1759   PO2ART 78 (L) 12/18/2021 1759   HCO3 22.3 12/18/2021 1759   TCO2 23 12/18/2021 1759   ACIDBASEDEF 2.0 12/18/2021 1759   O2SAT 94 12/18/2021 1759     Coagulation Profile: No results for input(s): "INR", "PROTIME" in the last 168 hours.  Cardiac Enzymes: Recent Labs  Lab 12/18/21 0045 12/18/21 1415 12/19/21 0335 12/20/21 0440  CKTOTAL 1,557* 2,402* 1,582* 734*    HbA1C: Hgb A1c MFr Bld  Date/Time Value Ref Range Status  12/18/2021 08:52 AM 6.3 (H) 4.8 - 5.6 % Final    Comment:    (NOTE) Pre diabetes:          5.7%-6.4%  Diabetes:              >6.4%  Glycemic control for   <7.0% adults with diabetes     CBG: Recent Labs  Lab 12/19/21 1145 12/19/21 1614 12/19/21 1942 12/19/21 2311 12/20/21 0429  GLUCAP 83 87 87 96 79    CCT: 35 mins   Kennieth Rad, MSN, AG-ACNP-BC  Pulmonary & Critical Care 12/20/2021, 7:30 AM  See Amion for pager If no response to pager, please call PCCM consult pager After 7:00 pm call Elink

## 2021-12-21 DIAGNOSIS — G929 Unspecified toxic encephalopathy: Secondary | ICD-10-CM | POA: Diagnosis not present

## 2021-12-21 DIAGNOSIS — F1910 Other psychoactive substance abuse, uncomplicated: Secondary | ICD-10-CM | POA: Diagnosis not present

## 2021-12-21 LAB — BASIC METABOLIC PANEL
Anion gap: 7 (ref 5–15)
Anion gap: 10 (ref 5–15)
BUN: 12 mg/dL (ref 6–20)
BUN: 7 mg/dL (ref 6–20)
CO2: 25 mmol/L (ref 22–32)
CO2: 23 mmol/L (ref 22–32)
Calcium: 8.4 mg/dL — ABNORMAL LOW (ref 8.9–10.3)
Calcium: 9.3 mg/dL (ref 8.9–10.3)
Chloride: 109 mmol/L (ref 98–111)
Chloride: 106 mmol/L (ref 98–111)
Creatinine, Ser: 1.07 mg/dL (ref 0.61–1.24)
Creatinine, Ser: 1.11 mg/dL (ref 0.61–1.24)
GFR, Estimated: >60 mL/min (ref >60)
GFR, Estimated: >60 mL/min (ref >60)
Glucose, Bld: 105 mg/dL — ABNORMAL HIGH (ref 70–99)
Glucose, Bld: 129 mg/dL — ABNORMAL HIGH (ref 70–99)
Potassium: 3.2 mmol/L — ABNORMAL LOW (ref 3.5–5.1)
Potassium: 3.8 mmol/L (ref 3.5–5.1)
Sodium: 141 mmol/L (ref 135–145)
Sodium: 139 mmol/L (ref 135–145)

## 2021-12-21 LAB — GLUCOSE, CAPILLARY
Glucose-Capillary: 103 mg/dL — ABNORMAL HIGH (ref 70–99)
Glucose-Capillary: 107 mg/dL — ABNORMAL HIGH (ref 70–99)
Glucose-Capillary: 108 mg/dL — ABNORMAL HIGH (ref 70–99)
Glucose-Capillary: 131 mg/dL — ABNORMAL HIGH (ref 70–99)
Glucose-Capillary: 93 mg/dL (ref 70–99)
Glucose-Capillary: 95 mg/dL (ref 70–99)

## 2021-12-21 LAB — CBC
HCT: 41.3 % (ref 39.0–52.0)
Hemoglobin: 13.3 g/dL (ref 13.0–17.0)
MCH: 23.1 pg — ABNORMAL LOW (ref 26.0–34.0)
MCHC: 32.2 g/dL (ref 30.0–36.0)
MCV: 71.6 fL — ABNORMAL LOW (ref 80.0–100.0)
Platelets: 206 10*3/uL (ref 150–400)
RBC: 5.77 MIL/uL (ref 4.22–5.81)
RDW: 14.6 % (ref 11.5–15.5)
WBC: 7.3 10*3/uL (ref 4.0–10.5)
nRBC: 0 % (ref 0.0–0.2)

## 2021-12-21 MED ORDER — POTASSIUM CHLORIDE CRYS ER 20 MEQ PO TBCR
20.0000 meq | EXTENDED_RELEASE_TABLET | ORAL | Status: AC
  Administered 2021-12-21 (×2): 20 meq via ORAL
  Filled 2021-12-21 (×2): qty 1

## 2021-12-21 MED ORDER — AMLODIPINE BESYLATE 5 MG PO TABS
5.0000 mg | ORAL_TABLET | Freq: Every day | ORAL | Status: DC
  Administered 2021-12-22: 5 mg via ORAL
  Filled 2021-12-21: qty 1

## 2021-12-21 MED ORDER — POLYETHYLENE GLYCOL 3350 17 G PO PACK: 17.0000 g | PACK | Freq: Every day | ORAL | Status: DC

## 2021-12-21 MED ORDER — POTASSIUM CHLORIDE 10 MEQ/50ML IV SOLN
10.0000 meq | INTRAVENOUS | Status: AC
  Administered 2021-12-21 (×4): 10 meq via INTRAVENOUS
  Filled 2021-12-21 (×4): qty 50

## 2021-12-21 MED ORDER — DOCUSATE SODIUM 50 MG/5ML PO LIQD: 100.0000 mg | Freq: Two times a day (BID) | ORAL | Status: DC

## 2021-12-21 MED ORDER — POTASSIUM CHLORIDE CRYS ER 20 MEQ PO TBCR
20.0000 meq | EXTENDED_RELEASE_TABLET | Freq: Two times a day (BID) | ORAL | Status: AC
Start: 2021-12-21 — End: 2021-12-21
  Administered 2021-12-21 (×2): 20 meq via ORAL
  Filled 2021-12-21 (×2): qty 1

## 2021-12-21 NOTE — Progress Notes (Signed)
NAME:  Jimmy Jarvis, MRN:  [id number], DOB:  May 19, 1990, LOS: 3 ADMISSION DATE:  12/18/2021, CONSULTATION DATE:  12/18/21 REFERRING MD:  Dr. Nechama Guard, CHIEF COMPLAINT:  agitation   History of Present Illness:  HPI obtained from medical chart review as patient is intubated and sedated on MV.   31 year old male with prior history of DM, obesity, and polysubstance abuse who presented from [facility] with agitated delirium.  Seen in ER on 8/3 for concerns of withdrawal from heroin and meth/ ICE with N/V/ mild diarrhea.  At that time he smoked/snorted heroin 8/2, denied IV heroin, and "ICE" believed to be meth on 8/3 and denied any SI intentions.  He was treated and stable for discharge home later that evening.    Returned to ER 8/4 by EMS from [facility] after he presented there for detox from heroin, noted to be having involuntary movements and diaphoretic.  Given valium, suboxone, and valium at [facility] then sent to ER via EMS in which he was given versed without improvement.  In ER, temp 100.2, he remains tachycardic, hypertensive, and progressively agitated, combative, confused, and diaphoretic despite cumulative total of versed 4m, followed by maxed out precedex drip which did not help, therefore to maintain safety, he was sedated and intubated and placed on fentanyl and versed drips.  Labs notable for K 3.7, sCr 1.39 (1.2 yesterday), AST 69, CK 2402 (1557 yesterday), serum osmolality 308, WBC 17.2, Hgb 17.5, HCT 56.3, Negative ETOH, salicylate.  Treated with 3L IVF.  CXR initially showed right mainstem intubation, since retracted.  PCCM called for admit.   Pertinent  Medical History  DM, obesity, polysubstance abuse- heroin, meth/ ICE  Significant Hospital Events: Including procedures, antibiotic start and stop dates in addition to other pertinent events   8/4 admitted, intubated 8/5 agitation remains an issue, BP elevated, foley placed for retention 8/6 extubated  Interim History / Subjective:  Extubated  yesterday. Vitals stable.  Objective   Blood pressure 131/66, pulse 92, temperature 98.9 F (37.2 C), temperature source Oral, resp. rate 11, height _0  (1.778 m), weight 115.4 kg, SpO2 100 %.    Vent Mode: PSV;CPAP FiO2 (%):  [30 %] 30 % PEEP:  [5 cmH20] 5 cmH20 Pressure Support:  [5 cmH20] 5 cmH20   Intake/Output Summary (Last 24 hours) at 12/21/2021 0827 Last data filed at 12/21/2021 0741 Gross per 24 hour  Intake 995.12 ml  Output 4000 ml  Net -3004.88 ml    Filed Weights   12/18/21 1928 12/20/21 0436 12/21/21 0500  Weight: 114.9 kg 115.4 kg 115.4 kg   Examination: General:  Adult male, resting in bed, in NAD HEENT: [place]/AT, MMM, EOMI Neuro:  A&O x, 3 no deficits CV: RRR, no M/R/G PULM:  Normal effort. CTAB GI: BS x 4, S/NT/ND Extremities: warm/dry, no LE edema  Skin: no rashes   Assessment & Plan:   Acute toxic/ metabolic encephalopathy and agitated delirium 2/2 polysubstance withdrawal. Polysubstance abuse - heroin, cocaine, meth. - Avoid sedating meds. - Needs psych consult as he is interested in detox program.  CAP - BAL growing strep pneumo. - Continue CTX. - Bronchial hygiene.  Hypokalemia - s/p repletion. - Follow BMP.  DM, controlled (A1c 6.3). - SSI if glucose consistently >180, currently not needed.  Stable for transfer out of ICU. Will ask TR[facility] to assume care AM 8/8 with PCCM off. Needs psych consult, ? Inpatient admission for detox program.   Best Practice (right click and "Reselect all SmartList Selections"  daily)   Diet/type: Regular DVT prophylaxis: Subcu Lovenox GI prophylaxis: PPI Lines: Central line, yes needed Foley:  N/A Code Status:  full code Last date of multidisciplinary goals of care discussion [8/4]    Montey Hora, [place] For pager details, please see AMION or use Epic chat  After 1900, please call [place] for cross coverage needs 12/21/2021, 8:45 AM

## 2021-12-21 NOTE — Progress Notes (Signed)
[facility] ADULT ICU REPLACEMENT PROTOCOL   The patient does apply for the [facility] Adult ICU Electrolyte Replacment Protocol based on the criteria listed below:   1.Exclusion criteria: TCTS patients, ECMO patients, and Dialysis patients 2. Is GFR >/= 30 ml/min? Yes.    Patient's GFR today is >60 3. Is SCr </= 2? Yes.   Patient's SCr is 1.07 mg/dL 4. Did SCr increase >/= 0.5 in 24 hours? No. 5.Pt's weight >40kg  Yes.   6. Abnormal electrolyte(s): K  7. Electrolytes replaced per protocol 8.  Call MD STAT for K+ </= 2.5, Phos </= 1, or Mag </= 1 Physician:  Alpha Gula Garett Tetzloff 12/21/2021 1:32 AM

## 2021-12-21 NOTE — Consult Note (Signed)
  HPI obtained from medical chart review as patient is intubated and sedated on MV.    30 year old male with prior history of DM, obesity, and polysubstance abuse who presented from [facility] with agitated delirium.  Seen in ER on 8/3 for concerns of withdrawal from heroin and meth/ ICE with N/V/ mild diarrhea.  At that time he smoked/snorted heroin 8/2, denied IV heroin, and "ICE" believed to be meth on 8/3 and denied any SI intentions.  He was treated and stable for discharge home later that evening.     Psych consult placed for?  Inpatient admission, detox.  Further clarification was sought by ordering provider, patient interested in rehabilitation and inpatient detox.  Instructed to place [facility] consult for inpatient detox and rehab.  Nurse practitioner did briefly introduced self and offer assistance with identifying of services.  Mother does appear interested in [facility], we did review criteria and eligibility.  We also discussed some long-term programs to include during rescue mission and [facility].  Mother is encouraged to start referral process immediately, as bed availability has been limited in this process can take days to weeks.  Patient did not present with any questions, concerns, and or comments.  Nurse practitioner answered all of mother's questions and concerns.  Both verbalized understanding. -No additional recommendations and or changes were made.   Will discontinue psych consult at this time.

## 2021-12-21 NOTE — TOC Initial Note (Addendum)
Transition of Care [facility]) - Initial/Assessment Note    Patient Details  Name: VALE MOUSSEAU MRN: [id number] Date of Birth: Oct 12, 1990  Transition of Care [facility]) CM/SW Contact:    Milinda Antis, [place] Phone Number: 12/21/2021, 11:24 AM  Clinical Narrative:                 CSW received consult for SA and met with the patient and mother at bedside.  Patient expresses willingness to abstain from using substances and is requesting an inpatient facility [facility]).  Patient is currently homeless (staying at a friends home) and will need transportation at discharge.  CSW contacted financial navigator to inquire about the patient being assessed for medicaid as he is unisured.  There is a Insurance underwriter currently assigned to assess this patient for Medicaid.  CSW contacted [facility] and left a secure VM for [facility] in admissions and is awaiting a response.  16:43-  CSW received a returned call from [place] with admissions at [facility].  CSW faxed over requested items and is awaiting a determination about facility's ability to accept.   TOC will continue to follow.     Expected Discharge Plan: IP Rehab Facility Barriers to Discharge: Financial Resources, Continued Medical Work up, Transportation, Other (must enter comment), Inadequate or no insurance (Inpatient rehab facility needed)   Patient Goals and CMS Choice Patient states their goals for this hospitalization and ongoing recovery are:: To go to rehab      Expected Discharge Plan and Services Expected Discharge Plan: [place]       Living arrangements for the past 2 months: Homeless (Living with friends who were drug users)                                      Prior Living Arrangements/Services Living arrangements for the past 2 months: Homeless (Living with friends who were drug users) Lives with:: Friends Patient language and need for interpreter reviewed:: Yes        Need for Family Participation in Patient Care:  Yes (Comment)     Criminal Activity/Legal Involvement Pertinent to Current Situation/Hospitalization: No - Comment as needed  Activities of Daily Living      Permission Sought/Granted Permission sought to share information with : Family Supports, Other (comment) (CSW) Permission granted to share information with : Yes, Verbal Permission Granted     Permission granted to share info w AGENCY: rehab facilities  Permission granted to share info w Relationship: Lolita Little, mother     Emotional Assessment Appearance:: Appears stated age Attitude/Demeanor/Rapport: Engaged Affect (typically observed): Accepting, Pleasant Orientation: : Oriented to Self, Oriented to Place, Oriented to  Time, Oriented to Situation Alcohol / Substance Use: Illicit Drugs Psych Involvement: No (comment)  Admission diagnosis:  Toxic encephalopathy [G92.9] Encephalopathy [G93.40] Polysubstance abuse ([place]) [F19.10] Non-traumatic rhabdomyolysis [M62.82] Altered mental status, unspecified altered mental status type [R41.82] Patient Active Problem List   Diagnosis Date Noted   Toxic encephalopathy 12/18/2021   PCP:  Patient, No Pcp Per Pharmacy:   [facility] [address] ([address]), [place] [address] [place]) [place] [address] Phone: [phone] Fax: [phone]     Social Determinants of Health (SDOH) Interventions    Readmission Risk Interventions     No data to display

## 2021-12-21 NOTE — Evaluation (Signed)
Physical Therapy Evaluation Patient Details Name: Jimmy Jarvis MRN: [id number] DOB: [birth year] Today's Date: 12/21/2021  History of Present Illness  Pt is 31 yo male presented on 12/18/21 with agitated delirium, aspirated, and became hypoxic requiring intubation.  ETT 12/18/21 - 12/20/21.  Pt with acute toxic metabolic encephalopathy due to polysubstance abuse/withdrawl.  Pt with hx of DM2, morbid obesity, and polysubstance abuse. Noted in [facility] note pt requesting inpatient rehab for substance abuse ([facility]).  Clinical Impression  Pt admitted with above diagnosis. At baseline pt is independent - he does report occasional use of cane due to gout.  Pt was homeless and staying with friends who used drugs (per chart).  Today, pt cooperative with therapy, answered questions appropriately but with slightly increased time, and flat affect.  He was able to ambulate 35' but fatigued easily and required min guard for safety (mild instability).  Had c/o mild dizziness but eye tracking, gaze stabilization, saccades intact.  Pt had elevated BP - notified nursing staff.  Pt currently with functional limitations due to the deficits listed below (see PT Problem List). Pt will benefit from skilled PT to increase their independence and safety with mobility to allow discharge to the venue listed below.          Recommendations for follow up therapy are one component of a multi-disciplinary discharge planning process, led by the attending physician.  Recommendations may be updated based on patient status, additional functional criteria and insurance authorization.  Follow Up Recommendations No PT follow up      Assistance Recommended at Discharge PRN  Patient can return home with the following  A little help with walking and/or transfers;A little help with bathing/dressing/bathroom    Equipment Recommendations None recommended by PT  Recommendations for Other Services       Functional Status Assessment Patient  has had a recent decline in their functional status and demonstrates the ability to make significant improvements in function in a reasonable and predictable amount of time.     Precautions / Restrictions Precautions Precautions: Fall      Mobility  Bed Mobility Overal bed mobility: Independent                  Transfers Overall transfer level: Needs assistance Equipment used: None Transfers: Sit to/from Stand Sit to Stand: Min guard           General transfer comment: Performed multiple sit to stands; slightly impulsive with lines/leads requring cues; min guard to steady    Ambulation/Gait Ambulation/Gait assistance: Min guard Gait Distance (Feet): 80 Feet Assistive device: None Gait Pattern/deviations: Step-through pattern, Wide base of support Gait velocity: decreased     General Gait Details: Pt with increased lateral sway to the right; requriing min guard for stability; easily fatigued  Stairs            Wheelchair Mobility    Modified Rankin (Stroke Patients Only)       Balance Overall balance assessment: Needs assistance Sitting-balance support: No upper extremity supported Sitting balance-Leahy Scale: Good     Standing balance support: No upper extremity supported Standing balance-Leahy Scale: Fair                               Pertinent Vitals/Pain Pain Assessment Pain Assessment: No/denies pain    Home Living Family/patient expects to be discharged to:: Shelter/Homeless (Pt was staying with friends who are drug users)  Prior Function Prior Level of Function : Independent/Modified Independent             Mobility Comments: Occasionally used cane due to gout; prior L hip surgery as a child       Hand Dominance        Extremity/Trunk Assessment   Upper Extremity Assessment Upper Extremity Assessment: Overall WFL for tasks assessed    Lower Extremity Assessment Lower  Extremity Assessment: Overall WFL for tasks assessed    Cervical / Trunk Assessment Cervical / Trunk Assessment: Normal  Communication   Communication: No difficulties  Cognition Arousal/Alertness: Awake/alert Behavior During Therapy: Flat affect Overall Cognitive Status: Within Functional Limits for tasks assessed                                 General Comments: Pt answering question appropriately with increased time. Very flat affect. Does state "I didn't know that I died."  -per chart review noted pt was intubated but no mention of PEA          General Comments General comments (skin integrity, edema, etc.): Pt's O2 sats 98% (did read 70% walking but poor pleth and once hand rested immediately back to 98%); HR 98-118 bpm; BP was elevated 156/115 post walk. Notified nursing staff.  Pt had c/o dizziness that he described as "objects jumping."  This was present constantly.  Tracking, gaze stabilization, and saccades all intact without nystagmus.  Pt had head CT that was negative for acute changes.    Exercises     Assessment/Plan    PT Assessment Patient needs continued PT services (elevated BP)  PT Problem List Decreased mobility;Decreased safety awareness;Decreased activity tolerance;Cardiopulmonary status limiting activity;Decreased balance;Decreased knowledge of use of DME;Decreased cognition       PT Treatment Interventions DME instruction;Therapeutic activities;Gait training;Therapeutic exercise;Patient/family education;Stair training;Balance training;Functional mobility training    PT Goals (Current goals can be found in the Care Plan section)  Acute Rehab PT Goals Patient Stated Goal: To feel better; noted plan per [facility] note inpatient rehab for substance abuse PT Goal Formulation: With patient Time For Goal Achievement: 01/04/22 Potential to Achieve Goals: Good    Frequency Min 3X/week     Co-evaluation               AM-PAC PT "6 Clicks"  Mobility  Outcome Measure Help needed turning from your back to your side while in a flat bed without using bedrails?: None Help needed moving from lying on your back to sitting on the side of a flat bed without using bedrails?: None Help needed moving to and from a bed to a chair (including a wheelchair)?: A Little Help needed standing up from a chair using your arms (e.g., wheelchair or bedside chair)?: A Little Help needed to walk in hospital room?: A Little Help needed climbing 3-5 steps with a railing? : A Little 6 Click Score: 20    End of Session Equipment Utilized During Treatment: Gait belt Activity Tolerance: Patient limited by fatigue Patient left: in bed;with call bell/phone within reach;with bed alarm set Nurse Communication: Mobility status PT Visit Diagnosis: Other abnormalities of gait and mobility (R26.89)    Time: 6606-3016 PT Time Calculation (min) (ACUTE ONLY): 22 min   Charges:   PT Evaluation $PT Eval Moderate Complexity: 1 Mod          Sarika Baldini, PT Acute Rehab [employer] [phone]   Billey Chang  Jhonathan Desroches 12/21/2021, 5:45 PM

## 2021-12-22 ENCOUNTER — Other Ambulatory Visit (HOSPITAL_COMMUNITY): Payer: Self-pay

## 2021-12-22 DIAGNOSIS — E876 Hypokalemia: Secondary | ICD-10-CM

## 2021-12-22 DIAGNOSIS — J189 Pneumonia, unspecified organism: Secondary | ICD-10-CM

## 2021-12-22 DIAGNOSIS — G929 Unspecified toxic encephalopathy: Secondary | ICD-10-CM

## 2021-12-22 DIAGNOSIS — N179 Acute kidney failure, unspecified: Secondary | ICD-10-CM

## 2021-12-22 DIAGNOSIS — M6282 Rhabdomyolysis: Secondary | ICD-10-CM

## 2021-12-22 DIAGNOSIS — F1910 Other psychoactive substance abuse, uncomplicated: Secondary | ICD-10-CM

## 2021-12-22 DIAGNOSIS — R7303 Prediabetes: Secondary | ICD-10-CM

## 2021-12-22 LAB — COMPREHENSIVE METABOLIC PANEL
ALT: 30 U/L (ref 0–44)
AST: 25 U/L (ref 15–41)
Albumin: 3.4 g/dL — ABNORMAL LOW (ref 3.5–5.0)
Alkaline Phosphatase: 65 U/L (ref 38–126)
Anion gap: 8 (ref 5–15)
BUN: <5 mg/dL — ABNORMAL LOW (ref 6–20)
CO2: 23 mmol/L (ref 22–32)
Calcium: 9 mg/dL (ref 8.9–10.3)
Chloride: 108 mmol/L (ref 98–111)
Creatinine, Ser: 1.1 mg/dL (ref 0.61–1.24)
GFR, Estimated: >60 mL/min (ref >60)
Glucose, Bld: 109 mg/dL — ABNORMAL HIGH (ref 70–99)
Potassium: 3.4 mmol/L — ABNORMAL LOW (ref 3.5–5.1)
Sodium: 139 mmol/L (ref 135–145)
Total Bilirubin: 1.1 mg/dL (ref 0.3–1.2)
Total Protein: 6.1 g/dL — ABNORMAL LOW (ref 6.5–8.1)

## 2021-12-22 LAB — GLUCOSE, CAPILLARY
Glucose-Capillary: 101 mg/dL — ABNORMAL HIGH (ref 70–99)
Glucose-Capillary: 103 mg/dL — ABNORMAL HIGH (ref 70–99)
Glucose-Capillary: 105 mg/dL — ABNORMAL HIGH (ref 70–99)
Glucose-Capillary: 110 mg/dL — ABNORMAL HIGH (ref 70–99)
Glucose-Capillary: 111 mg/dL — ABNORMAL HIGH (ref 70–99)
Glucose-Capillary: 121 mg/dL — ABNORMAL HIGH (ref 70–99)

## 2021-12-22 LAB — CBC WITH DIFFERENTIAL/PLATELET
Abs Immature Granulocytes: 0.01 10*3/uL (ref 0.00–0.07)
Basophils Absolute: 0 10*3/uL (ref 0.0–0.1)
Basophils Relative: 0 %
Eosinophils Absolute: 0.1 10*3/uL (ref 0.0–0.5)
Eosinophils Relative: 1 %
HCT: 41.6 % (ref 39.0–52.0)
Hemoglobin: 13.4 g/dL (ref 13.0–17.0)
Immature Granulocytes: 0 %
Lymphocytes Relative: 30 %
Lymphs Abs: 2.3 10*3/uL (ref 0.7–4.0)
MCH: 22.6 pg — ABNORMAL LOW (ref 26.0–34.0)
MCHC: 32.2 g/dL (ref 30.0–36.0)
MCV: 70.3 fL — ABNORMAL LOW (ref 80.0–100.0)
Monocytes Absolute: 0.7 10*3/uL (ref 0.1–1.0)
Monocytes Relative: 10 %
Neutro Abs: 4.6 10*3/uL (ref 1.7–7.7)
Neutrophils Relative %: 59 %
Platelets: 218 10*3/uL (ref 150–400)
RBC: 5.92 MIL/uL — ABNORMAL HIGH (ref 4.22–5.81)
RDW: 14.1 % (ref 11.5–15.5)
WBC: 7.8 10*3/uL (ref 4.0–10.5)
nRBC: 0 % (ref 0.0–0.2)

## 2021-12-22 LAB — CULTURE, BAL-QUANTITATIVE W GRAM STAIN: Culture: >=100000 — AB

## 2021-12-22 LAB — T4, FREE: Free T4: 1.12 ng/dL (ref 0.61–1.12)

## 2021-12-22 MED ORDER — HYDRALAZINE HCL 20 MG/ML IJ SOLN
10.0000 mg | Freq: Four times a day (QID) | INTRAMUSCULAR | Status: DC | PRN
Start: 2021-12-22 — End: 2021-12-23

## 2021-12-22 MED ORDER — AMLODIPINE BESYLATE 10 MG PO TABS: 10.0000 mg | ORAL_TABLET | Freq: Every day | ORAL | Status: DC

## 2021-12-22 MED ORDER — ONDANSETRON HCL 4 MG/2ML IJ SOLN
4.0000 mg | Freq: Once | INTRAMUSCULAR | Status: AC
  Administered 2021-12-22: 4 mg via INTRAVENOUS
  Filled 2021-12-22: qty 2

## 2021-12-22 MED ORDER — AMLODIPINE BESYLATE 10 MG PO TABS
10.0000 mg | ORAL_TABLET | Freq: Every day | ORAL | 1 refills | Status: DC
  Filled 2021-12-22: qty 30, 30d supply, fill #0

## 2021-12-22 MED ORDER — AMLODIPINE BESYLATE 5 MG PO TABS
5.0000 mg | ORAL_TABLET | Freq: Every day | ORAL | Status: AC
  Administered 2021-12-22: 5 mg via ORAL
  Filled 2021-12-22: qty 1

## 2021-12-22 MED ORDER — POTASSIUM CHLORIDE CRYS ER 20 MEQ PO TBCR
40.0000 meq | EXTENDED_RELEASE_TABLET | Freq: Once | ORAL | Status: AC
  Administered 2021-12-22: 40 meq via ORAL
  Filled 2021-12-22: qty 2

## 2021-12-22 MED ORDER — AMLODIPINE BESYLATE 5 MG PO TABS
5.0000 mg | ORAL_TABLET | Freq: Every day | ORAL | Status: DC
Start: 2021-12-22 — End: 2021-12-22

## 2021-12-22 NOTE — Progress Notes (Signed)
Patient should be able to go to inpatient rehab 12/23/2021  Transfer orders in place to transfer to medical floor

## 2021-12-22 NOTE — Progress Notes (Signed)
Nutrition Brief Note  Patient was extubated 8/6. Now on a regular diet. Meal intakes documented at 100%. No further nutrition interventions indicated at this time. Please re-consult as needed. Plans for D/C tomorrow.   Gabriel Rainwater RD, LDN, CNSC Please refer to Amion for contact information.

## 2021-12-22 NOTE — Progress Notes (Signed)
Physical Therapy Treatment Patient Details Name: Jimmy Jarvis MRN: [id number] DOB: 1991/01/26 Today's Date: 12/22/2021   History of Present Illness Pt is 31 yo male presented on 12/18/21 with agitated delirium, aspirated, and became hypoxic requiring intubation.  ETT 12/18/21 - 12/20/21.  Pt with acute toxic metabolic encephalopathy due to polysubstance abuse/withdrawl.  Pt with hx of DM2, morbid obesity, and polysubstance abuse. Noted in [facility] note pt requesting inpatient rehab for substance abuse ([facility]).    PT Comments    Pt improving toward goals.  Appears down, but determined with overall status, looking toward rehab.  Emphasis on progression of gait stability/stamina and overall dynamic balance and negotiation of stairs which were difficult for patient today.    Recommendations for follow up therapy are one component of a multi-disciplinary discharge planning process, led by the attending physician.  Recommendations may be updated based on patient status, additional functional criteria and insurance authorization.  Follow Up Recommendations  No PT follow up     Assistance Recommended at Discharge PRN  Patient can return home with the following Assistance with cooking/housework;Assist for transportation;Help with stairs or ramp for entrance   Equipment Recommendations  None recommended by PT    Recommendations for Other Services       Precautions / Restrictions Precautions Precautions: Fall (moderate risk)     Mobility  Bed Mobility Overal bed mobility: Independent                  Transfers Overall transfer level: Independent                 General transfer comment: appropriate use of hands, mildly unsteady overall, but not unrecoverable LOB    Ambulation/Gait Ambulation/Gait assistance: Supervision Gait Distance (Feet): 280 Feet Assistive device: None Gait Pattern/deviations: Step-through pattern Gait velocity: decreased Gait velocity  interpretation: 1.31 - 2.62 ft/sec, indicative of limited community ambulator   General Gait Details: mild unsteadiness with drift and soft stagger in attempt to maintain balance.  Still slow and not age-appropriate.   Stairs Stairs: Yes Stairs assistance: Min guard Stair Management: Two rails, Step to pattern, Forwards Number of Stairs: 2 General stair comments: laborious, but no assist necessary   Wheelchair Mobility    Modified Rankin (Stroke Patients Only)       Balance Overall balance assessment: Needs assistance Sitting-balance support: No upper extremity supported Sitting balance-Leahy Scale: Good     Standing balance support: No upper extremity supported Standing balance-Leahy Scale: Fair                              Cognition Arousal/Alertness: Awake/alert Behavior During Therapy: Flat affect Overall Cognitive Status: Within Functional Limits for tasks assessed                                          Exercises      General Comments General comments (skin integrity, edema, etc.): vss      Pertinent Vitals/Pain Pain Assessment Pain Assessment: Faces Faces Pain Scale: Hurts a little bit Pain Location: stomach Pain Descriptors / Indicators:  (nausea) Pain Intervention(s): Monitored during session    Home Living                          Prior Function  PT Goals (current goals can now be found in the care plan section) Acute Rehab PT Goals Patient Stated Goal: To feel better; noted plan per [facility] note inpatient rehab for substance abuse PT Goal Formulation: With patient Time For Goal Achievement: 01/04/22 Potential to Achieve Goals: Good Progress towards PT goals: Progressing toward goals    Frequency    Min 3X/week      PT Plan Current plan remains appropriate    Co-evaluation              AM-PAC PT "6 Clicks" Mobility   Outcome Measure  Help needed turning from your back to  your side while in a flat bed without using bedrails?: None Help needed moving from lying on your back to sitting on the side of a flat bed without using bedrails?: None Help needed moving to and from a bed to a chair (including a wheelchair)?: None Help needed standing up from a chair using your arms (e.g., wheelchair or bedside chair)?: None Help needed to walk in hospital room?: None Help needed climbing 3-5 steps with a railing? : A Little 6 Click Score: 23    End of Session   Activity Tolerance: Patient tolerated treatment well;Patient limited by fatigue Patient left: in bed;with call bell/phone within reach;with bed alarm set Nurse Communication: Mobility status PT Visit Diagnosis: Other abnormalities of gait and mobility (R26.89)     Time: 1212-1229 PT Time Calculation (min) (ACUTE ONLY): 17 min  Charges:  $Gait Training: 8-22 mins                     12/22/2021  Jimmy Jarvis., PT Acute Rehabilitation Services [phone]  (pager) [phone]  (office)   Jimmy Jarvis 12/22/2021, 12:47 PM

## 2021-12-22 NOTE — Progress Notes (Signed)
eLink Physician-Brief Progress Note Patient Name: Jimmy Jarvis DOB: 1991-01-03 MRN: [id number]   Date of Service  12/22/2021  HPI/Events of Note  Nausea, Qtc is 0.47  eICU Interventions  Zofran ordered      Intervention Category Minor Interventions: Routine modifications to care plan (e.g. PRN medications for pain, fever)  Oretha Milch 12/22/2021, 4:28 AM
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
## 2021-12-22 NOTE — Progress Notes (Signed)
[facility] ADULT ICU REPLACEMENT PROTOCOL   The patient does apply for the [facility] Adult ICU Electrolyte Replacment Protocol based on the criteria listed below:   1.Exclusion criteria: TCTS patients, ECMO patients, and Dialysis patients 2. Is GFR >/= 30 ml/min? Yes.    Patient's GFR today is >60 3. Is SCr </= 2? Yes.   Patient's SCr is 1.10 mg/dL 4. Did SCr increase >/= 0.5 in 24 hours? No. 5.Pt's weight >40kg  Yes.   6. Abnormal electrolyte(s): K+ 3.4  7. Electrolytes replaced per protocol 8.  Call MD STAT for K+ </= 2.5, Phos </= 1, or Mag </= 1 Physician:  n/a  Melvern Banker 12/22/2021 2:13 AM

## 2021-12-22 NOTE — TOC Progression Note (Addendum)
Transition of Care [facility]) - Initial/Assessment Note    Patient Details  Name: Jimmy Jarvis MRN: [id number] Date of Birth: 1991/04/17  Transition of Care [facility]) CM/SW Contact:    Ralene Bathe, LCSWA Phone Number: 12/22/2021, 10:51 AM  Clinical Narrative:                 CSW contacted Marcelino Duster in admissions at [facility] to inquire about referral.  The facility can accept the patient tomorrow morning @ 9am.  Patient will need a 30 day supply of medications and one refill.  Care team notified.  CSW requested that d/c medication be sent to [facility] pharmacy today as to not delay d/c tomorrow morning.    1500-  Patient's family notified. Patient's mother will transport patient to [facility] in the morning.    Expected Discharge Plan: IP Rehab Facility Barriers to Discharge: Financial Resources, Continued Medical Work up, Transportation, Other (must enter comment), Inadequate or no insurance (Inpatient rehab facility needed)   Patient Goals and CMS Choice Patient states their goals for this hospitalization and ongoing recovery are:: To go to rehab      Expected Discharge Plan and Services Expected Discharge Plan: IP Rehab Facility       Living arrangements for the past 2 months: Homeless (Living with friends who were drug users)                                      Prior Living Arrangements/Services Living arrangements for the past 2 months: Homeless (Living with friends who were drug users) Lives with:: Friends Patient language and need for interpreter reviewed:: Yes        Need for Family Participation in Patient Care: Yes (Comment)     Criminal Activity/Legal Involvement Pertinent to Current Situation/Hospitalization: No - Comment as needed  Activities of Daily Living      Permission Sought/Granted Permission sought to share information with : Family Supports, Other (comment) (CSW) Permission granted to share information with : Yes, Verbal Permission Granted      Permission granted to share info w AGENCY: rehab facilities  Permission granted to share info w Relationship: Lolita Little, mother     Emotional Assessment Appearance:: Appears stated age Attitude/Demeanor/Rapport: Engaged Affect (typically observed): Accepting, Pleasant Orientation: : Oriented to Self, Oriented to Place, Oriented to  Time, Oriented to Situation Alcohol / Substance Use: Illicit Drugs Psych Involvement: No (comment)  Admission diagnosis:  Toxic encephalopathy [G92.9] Encephalopathy [G93.40] Polysubstance abuse (HCC) [F19.10] Non-traumatic rhabdomyolysis [M62.82] Altered mental status, unspecified altered mental status type [R41.82] Patient Active Problem List   Diagnosis Date Noted   Toxic encephalopathy 12/18/2021   PCP:  Patient, No Pcp Per Pharmacy:   [facility] [address] ([address]), [place] - [address] [place] ([place]) [place] [address] Phone: [phone] Fax: [phone]     Social Determinants of Health (SDOH) Interventions    Readmission Risk Interventions     No data to display

## 2021-12-22 NOTE — Discharge Instructions (Signed)

## 2021-12-23 ENCOUNTER — Encounter (HOSPITAL_COMMUNITY): Payer: Self-pay | Admitting: Emergency Medicine

## 2021-12-23 ENCOUNTER — Emergency Department (HOSPITAL_COMMUNITY)
Admission: EM | Admit: 2021-12-23 | Discharge: 2021-12-23 | Disposition: A | Discharge status: Home / Self Care | Payer: Self-pay | Attending: Emergency Medicine | Admitting: Emergency Medicine

## 2021-12-23 ENCOUNTER — Ambulatory Visit (HOSPITAL_COMMUNITY)
Admission: AD | Admit: 2021-12-23 | Discharge: 2021-12-23 | Disposition: A | Discharge status: Home / Self Care | Payer: No Payment, Other | Attending: Psychiatry | Admitting: Psychiatry

## 2021-12-23 ENCOUNTER — Ambulatory Visit (HOSPITAL_COMMUNITY)
Admission: EM | Admit: 2021-12-23 | Discharge: 2021-12-23 | Disposition: A | Discharge status: Other Acute Inpatient Hospital | Payer: No Payment, Other | Attending: Psychiatry | Admitting: Psychiatry

## 2021-12-23 ENCOUNTER — Other Ambulatory Visit: Payer: Self-pay

## 2021-12-23 DIAGNOSIS — F22 Delusional disorders: Secondary | ICD-10-CM | POA: Insufficient documentation

## 2021-12-23 DIAGNOSIS — F1123 Opioid dependence with withdrawal: Secondary | ICD-10-CM | POA: Diagnosis not present

## 2021-12-23 DIAGNOSIS — F1911 Other psychoactive substance abuse, in remission: Secondary | ICD-10-CM | POA: Diagnosis not present

## 2021-12-23 DIAGNOSIS — R251 Tremor, unspecified: Secondary | ICD-10-CM | POA: Insufficient documentation

## 2021-12-23 DIAGNOSIS — Z5321 Procedure and treatment not carried out due to patient leaving prior to being seen by health care provider: Secondary | ICD-10-CM | POA: Insufficient documentation

## 2021-12-23 DIAGNOSIS — R0681 Apnea, not elsewhere classified: Secondary | ICD-10-CM | POA: Insufficient documentation

## 2021-12-23 DIAGNOSIS — R443 Hallucinations, unspecified: Secondary | ICD-10-CM | POA: Insufficient documentation

## 2021-12-23 DIAGNOSIS — F1193 Opioid use, unspecified with withdrawal: Secondary | ICD-10-CM

## 2021-12-23 LAB — BASIC METABOLIC PANEL
Anion gap: 9 (ref 5–15)
BUN: 8 mg/dL (ref 6–20)
CO2: 26 mmol/L (ref 22–32)
Calcium: 9.7 mg/dL (ref 8.9–10.3)
Chloride: 105 mmol/L (ref 98–111)
Creatinine, Ser: 0.95 mg/dL (ref 0.61–1.24)
GFR, Estimated: >60 mL/min (ref >60)
Glucose, Bld: 104 mg/dL — ABNORMAL HIGH (ref 70–99)
Potassium: 3.7 mmol/L (ref 3.5–5.1)
Sodium: 140 mmol/L (ref 135–145)

## 2021-12-23 LAB — CULTURE, BLOOD (ROUTINE X 2)
Culture: NO GROWTH
Culture: NO GROWTH

## 2021-12-23 LAB — GLUCOSE, CAPILLARY: Glucose-Capillary: 97 mg/dL (ref 70–99)

## 2021-12-23 LAB — MAGNESIUM: Magnesium: 2.1 mg/dL (ref 1.7–2.4)

## 2021-12-23 LAB — T3, FREE: T3, Free: 2.2 pg/mL (ref 2.0–4.4)

## 2021-12-23 LAB — PHOSPHORUS: Phosphorus: 3.9 mg/dL (ref 2.5–4.6)

## 2021-12-23 NOTE — Plan of Care (Signed)
  Problem: Safety: Goal: Non-violent Restraint(s) Outcome: Adequate for Discharge   Problem: Activity: Goal: Ability to tolerate increased activity will improve Outcome: Adequate for Discharge   Problem: Respiratory: Goal: Ability to maintain a clear airway and adequate ventilation will improve Outcome: Adequate for Discharge   Problem: Role Relationship: Goal: Method of communication will improve Outcome: Adequate for Discharge   Problem: Education: Goal: Ability to describe self-care measures that may prevent or decrease complications (Diabetes Survival Skills Education) will improve Outcome: Adequate for Discharge Goal: Individualized Educational Video(s) Outcome: Adequate for Discharge   Problem: Coping: Goal: Ability to adjust to condition or change in health will improve Outcome: Adequate for Discharge   Problem: Fluid Volume: Goal: Ability to maintain a balanced intake and output will improve Outcome: Adequate for Discharge   Problem: Health Behavior/Discharge Planning: Goal: Ability to identify and utilize available resources and services will improve Outcome: Adequate for Discharge Goal: Ability to manage health-related needs will improve Outcome: Adequate for Discharge   Problem: Metabolic: Goal: Ability to maintain appropriate glucose levels will improve Outcome: Adequate for Discharge   Problem: Nutritional: Goal: Maintenance of adequate nutrition will improve Outcome: Adequate for Discharge Goal: Progress toward achieving an optimal weight will improve Outcome: Adequate for Discharge   Problem: Skin Integrity: Goal: Risk for impaired skin integrity will decrease Outcome: Adequate for Discharge   Problem: Tissue Perfusion: Goal: Adequacy of tissue perfusion will improve Outcome: Adequate for Discharge   Problem: Education: Goal: Knowledge of General Education information will improve Description: Including pain rating scale, medication(s)/side  effects and non-pharmacologic comfort measures Outcome: Adequate for Discharge   Problem: Health Behavior/Discharge Planning: Goal: Ability to manage health-related needs will improve Outcome: Adequate for Discharge   Problem: Clinical Measurements: Goal: Ability to maintain clinical measurements within normal limits will improve Outcome: Adequate for Discharge Goal: Will remain free from infection Outcome: Adequate for Discharge Goal: Diagnostic test results will improve Outcome: Adequate for Discharge Goal: Respiratory complications will improve Outcome: Adequate for Discharge Goal: Cardiovascular complication will be avoided Outcome: Adequate for Discharge   Problem: Activity: Goal: Risk for activity intolerance will decrease Outcome: Adequate for Discharge   Problem: Nutrition: Goal: Adequate nutrition will be maintained Outcome: Adequate for Discharge   Problem: Coping: Goal: Level of anxiety will decrease Outcome: Adequate for Discharge   Problem: Elimination: Goal: Will not experience complications related to bowel motility Outcome: Adequate for Discharge Goal: Will not experience complications related to urinary retention Outcome: Adequate for Discharge   Problem: Pain Managment: Goal: General experience of comfort will improve Outcome: Adequate for Discharge   Problem: Safety: Goal: Ability to remain free from injury will improve Outcome: Adequate for Discharge   Problem: Skin Integrity: Goal: Risk for impaired skin integrity will decrease Outcome: Adequate for Discharge

## 2021-12-23 NOTE — Progress Notes (Signed)
AVS provided to pt. All questions answered. Pt discharged with mom with TOC meds.

## 2021-12-23 NOTE — H&P (Signed)
Behavioral Health Medical Screening Exam  HPI: Jimmy Jarvis is a 31 y.o. African-American male who presents voluntarily accompanied by his mother to [facility] for heroin use, methamphetamine use and fentanyl use.  Patient was seen at [facility] long ED on December 17, 2021, and then at [facility] behavioral health urgent care same day, then he proceeded to [facility] emergency room ICU on Friday, December 18, 2021, and was discharged today December 23, 2021.  From then he proceeded to [facility], then back with  his mother Jimmy Jarvis behavioral health urgent care, then from here to Jimmy Jarvis, ER again, then presented to behavioral health Hospital.  Patient states he is homeless.  Assessment:  On examination today patient is seen face-to-face and examined in the screening room.  He appears disheveled and bizarre.  Chart reviewed and findings shared with the treatment team and discussed with Dr. Lucianne Muss.  Appeared alert to name and place however, speech is low in volume.  Eye contact fleeting. Presents with anxious and depressed mood and with congruent affect.  Thought process linear with paranoid, and rumination thought content.  Patient's mother has to respond to most of the questions. Memory is fair, but judgment is poor and insight is shallow.  Reports that the trigger for his behavior is homelessness.  Denies suicidal thought, homicidal thought, auditory/visual hallucinations.  Denies suicidal attempts in the past or present, denies self-injurious behavior, denies anxiety and denies being followed by a therapist/psychiatrist. Endorses sleeping for 0 hours for several days. Endorses family history of mental illness, with mom diagnosed with depression. Denies alcohol use, denies history of trauma or abuse, and denies access to firearms. Endorses drug use as indicated above and smoking 1 pack of cigarette in 2 days.  Instructions provided on cessation of substance use as it adversely  affects the body system in his thinking process.  Disposition: Based on my evaluation patient is not at imminent danger to himself.  I recommend outpatient psychiatric and counseling services.  Outpatient resources provided for patient and mother and brought left [facility] without any incidents.  Total Time spent with patient: 1 hour  Psychiatric Specialty Exam:  Presentation  General Appearance: Bizarre; Disheveled  Eye Contact:Fleeting  Speech:Clear and Coherent  Speech Volume:Decreased  Handedness:Right  Mood and Affect  Mood:Anxious; Depressed  Affect:Congruent  Thought Process  Thought Processes:Coherent; Linear  Descriptions of Associations:Intact  Orientation:Full (Time, Place and Person)  Thought Content:Logical; Paranoid Ideation; Rumination  History of Schizophrenia/Schizoaffective disorder:No  Duration of Psychotic Symptoms:N/A  Hallucinations:Hallucinations: None  Ideas of Reference:None  Suicidal Thoughts:Suicidal Thoughts: No SI Active Intent and/or Plan: -- (denies)  Homicidal Thoughts:Homicidal Thoughts: No  Sensorium  Memory:Immediate Fair; Recent Fair; Remote Fair  Judgment:Poor  Insight:Shallow  Executive Functions  Concentration:Fair  Attention Span:Fair  Recall:Fair  Fund of Knowledge:Fair  Language:Fair  Psychomotor Activity  Psychomotor Activity:Psychomotor Activity: Normal  Assets  Assets:Communication Skills; Physical Health; Social Support  Sleep  Sleep:Sleep: Poor Number of Hours of Sleep: 0  Physical Exam: Physical Exam Vitals reviewed.  Constitutional:      Appearance: Normal appearance.  HENT:     Head: Normocephalic and atraumatic.     Right Ear: External ear normal.     Left Ear: External ear normal.     Mouth/Throat:     Mouth: Mucous membranes are moist.     Pharynx: Oropharynx is clear.  Eyes:     Conjunctiva/sclera: Conjunctivae normal.     Pupils: Pupils are equal, round, and  reactive to light.   Cardiovascular:     Rate and Rhythm: Tachycardia present.  Pulmonary:     Effort: Pulmonary effort is normal.  Abdominal:     Palpations: Abdomen is soft.  Genitourinary:    Comments: deferred Musculoskeletal:        General: Normal range of motion.     Cervical back: Normal range of motion.  Skin:    General: Skin is warm.  Neurological:     General: No focal deficit present.     Mental Status: He is alert and oriented to person, place, and time.  Psychiatric:        Behavior: Behavior normal.    Review of Systems  Constitutional: Negative.  Negative for chills and fever.  HENT: Negative.  Negative for hearing loss and tinnitus.   Eyes: Negative.  Negative for blurred vision and double vision.  Respiratory: Negative.  Negative for cough, sputum production, shortness of breath and wheezing.   Cardiovascular: Negative.  Negative for chest pain and palpitations.  Gastrointestinal: Negative.  Negative for heartburn and nausea.  Genitourinary: Negative.  Negative for dysuria, frequency and urgency.  Musculoskeletal: Negative.  Negative for myalgias and neck pain.  Skin: Negative.  Negative for itching and rash.  Neurological: Negative.  Negative for dizziness and headaches.  Endo/Heme/Allergies: Negative.  Negative for polydipsia. Does not bruise/bleed easily.  Psychiatric/Behavioral:  Positive for substance abuse. The patient has insomnia.    There were no vitals taken for this visit. There is no height or weight on file to calculate BMI.  Musculoskeletal: Strength & Muscle Tone: within normal limits Gait & Station: normal Patient leans: N/A  [place] Scale:  Flowsheet Row OP [facility] Most recent reading at 12/23/2021  8:46 PM ED from 12/23/2021 in [facility] EMERGENCY DEPARTMENT Most recent reading at 12/23/2021  1:50 PM ED from 12/18/2021 in [facility] Most recent reading  at 12/18/2021  8:56 AM  C-SSRS RISK CATEGORY No Risk No Risk High Risk       Recommendations:  Based on my evaluation the patient does not appear to have an emergency medical condition.  Cecilie Lowers, FNP 12/23/2021, 8:48 PM

## 2021-12-23 NOTE — Progress Notes (Signed)
   12/23/21 1045  [facility] Triage Screening (Walk-ins at [facility] only)  How Did You Hear About [place]? Family/Friend  What Is the Reason for Your Visit/Call Today? Patient presents with mother was just d/c from [facility], admission after seen here at [facility] on Thurs for agitated delerium. Patient was just d/c this morning and was accepted to [facility] for Residential Tx.  When he arrived, he appeared paranoid, with psychosis and was sent here for treatment.  Per mother, he will likely be accepted once treated for psychosis.  Patient is unable to engaged in assessment, he appears agitated and mumbles to mother after each question.  Mother has some pertinent hx, however she has limited info regarding his SA hx.  She is aware of his meth, fentanyl and heroin use but doesn't know about use hx/patterns.  She states patient is homeless and lives in a tent behind [place] on [address]. No other psych hx per mother.  Patient does not deny or confirm SI, does not answer questions. Mother is concerned about patient's worsening paranoia and AH.  How Long Has This Been Causing You Problems? > than 6 months  Have You Recently Had Any Thoughts About Hurting Yourself?  (UTA)  Are You Planning to Commit Suicide/Harm Yourself At This time?  (UTA)  Have you Recently Had Thoughts About Hurting Someone Else?  (UTA)  Are You Planning To Harm Someone At This Time?  (UTA)  Are you currently experiencing any auditory, visual or other hallucinations?  (Appears to be paranoid, responding to internal stimuli)  Have You Used Any Alcohol or Drugs in the Past 24 Hours? No  Do you have any current medical co-morbidities that require immediate attention? No  Clinician description of patient physical appearance/behavior: Pt just d/c from hospital admission for agitated delerium  What Do You Feel Would Help You the Most Today? Alcohol or Drug Use Treatment  If access to [facility] was not available, would you have sought care in the Emergency  Department? Yes  Determination of Need Urgent (48 hours)  Options For Referral ED Referral;Facility-Based Crisis;Inpatient Hospitalization;BH Urgent Care

## 2021-12-23 NOTE — ED Notes (Signed)
Report called to Candace RN at [facility]. 911 called for transportation services to be provided. Pt currently in assessment room with family member. EMTALA printed and placed in envelope for transfer. Safety maintained.

## 2021-12-23 NOTE — ED Provider Notes (Cosign Needed Addendum)
Behavioral Health Urgent Care Medical Screening Exam  Patient Name: Jimmy Jarvis MRN: [id number] Date of Evaluation: 12/23/21 Chief Complaint:   Diagnosis:  Final diagnoses:  Opioid withdrawal (HCC)    History of Present illness:  Jimmy Jarvis is a 31 year old male with a past psychiatric history of polysubstance abuse.  He was recently admitted to [facility] for agitated delirium requiring intubation and large amounts of Precedex and fentanyl.  He is currently presenting to the [facility] behavioral health urgent care the day of discharge from [facility] complaining of hallucinations, paranoia, and withdrawal symptoms.  The patient was initially seen on 8/4 for withdrawal from heroin and methamphetamine.  The patient denied any recent alcohol consumption.  The patient was noted to have shaking, nausea, and hallucinations.  While admitted to the observation unit there he experienced a medical decompensation that resulted in him being transferred to the [facility] emergency department.  There he developed apnea and had to be intubated.  He was stabilized and extubated.  He was discharged on the morning of 8/9.  Today (8/9) the patient is seen with his mother.  The patient's mother provides much of the history as the patient is unable to provide a coherent account of the events.  He appears to be responding to internal stimuli at times.  He reports auditory hallucinations.  She states that she picked the patient up from the hospital at 7 AM this morning.  She states that she has been with him the whole time and that he has not used any substances.  She states that he became paranoid and was hallucinating.  They went to [facility] where the patient had a bed for rehab.  They were told to come back to the [facility] behavioral health urgent care to receive further psychiatric help.  Unfortunately, the patient reports that he is experiencing withdrawal symptoms.  He states  that he is nauseous, has a headache, and has had diarrhea.  He reports that he has intermittent tremors.  The patient denies taking any drugs since he was discharged from the hospital.  Discussed case with attending physician, Dr. Lucianne Muss.  Called and discussed case with Dr. Hyacinth Meeker, emergency department physician at [facility] who agrees to accept patient.  The patient will need inpatient psychiatric placement for psychosis after he is determined to be medically stable.     Psychiatric Specialty Exam  Presentation  General Appearance:Disheveled  Eye Contact:Minimal  Speech:Clear and Coherent; Slow  Speech Volume:Decreased  Handedness:No data recorded  Mood and Affect  Mood:Depressed  Affect:Congruent   Thought Process  Thought Processes:Coherent; Linear  Descriptions of Associations:Intact  Orientation:Full (Time, Place and Person)  Thought Content:Logical  Diagnosis of Schizophrenia or Schizoaffective disorder in past: No  Duration of Psychotic Symptoms: Less than six months  Hallucinations: AH Ideas of Reference:None  Suicidal Thoughts: denies Homicidal Thoughts:No   Sensorium  Memory:Immediate Fair; Remote Fair; Recent Fair  Judgment:Fair  Insight:Fair   Executive Functions  Concentration:Fair  Attention Span:Fair  Recall:Fair  Fund of Knowledge:Fair  Language:Fair   Psychomotor Activity  Psychomotor Activity:Normal   Assets  Assets:Communication Skills; Desire for Improvement; Leisure Time; Social Support; Physical Health   Sleep  Sleep:Poor   Physical Exam Constitutional:      Appearance: the patient is not toxic-appearing.  Pulmonary:     Effort: Pulmonary effort is normal.  Neurological:     General: No focal deficit present.     Mental Status: the patient is alert and  oriented to person, place, and time.   Review of Systems  Respiratory:  Negative for shortness of breath.   Cardiovascular:  Negative for chest pain.   Gastrointestinal:  Negative for abdominal pain, constipation. Positive for nausea. Neurological:  Negative for headaches. Positive for tremor.  Blood pressure (!) 151/103, pulse 95, temperature 99.2 F (37.3 C), resp. rate 19, SpO2 95 %. There is no height or weight on file to calculate BMI.  Musculoskeletal: Strength & Muscle Tone: within normal limits Gait & Station: normal Patient leans: N/A   [facility] MSE Discharge Disposition for Follow up and Recommendations: Based on my evaluation the patient appears to have an emergency medical condition for which I recommend the patient be transferred to the emergency department for further evaluation.   Disposition: recommend inpatient psychiatric placement    Carlyn Reichert, MD 12/23/2021, 1:02 PM

## 2021-12-23 NOTE — ED Notes (Signed)
Discharge instructions provided and Pt stated understanding. Pt alert, orient and ambulatory prior to d/c from facility. 911 and report called prior to d/c from facility. EMTALA given to EMS staff. Safety maintained.

## 2021-12-23 NOTE — ED Notes (Signed)
Patient's mother is concerned and upset about her son. Patient left ED with her.

## 2021-12-23 NOTE — BH Assessment (Signed)
Comprehensive Clinical Assessment (CCA) Note  12/23/2021 JENNINGS CORADO [id number]  Disposition: Per Teola Bradley, MD patient to transfer to the ED for further medical evaluation and treatment.  He will need inpatient psychiatric placement for psychosis after he is determined to be medically stable.  The patient demonstrates the following risk factors for suicide: Chronic risk factors for suicide include: substance use disorder. Acute risk factors for suicide include: ongoing SA issues. Protective factors for this patient include: positive social support, responsibility to others (children, family), and coping skills. Considering these factors, the overall suicide risk at this point appears to be low. Patient is appropriate for outpatient follow up once stabilized.   Patient is a 31 year old male with a history of polysubstance abuse who presents voluntarily with mother to [facility] for assessment. Patient preferred that mother stay for assessment.  Patient was just d/c from [facility] this morning; admitted after seen here at [facility] on 8/3 for meth and heroin withdrawal.   Patient began to decompensate medically, he collapsed and required medical admission to [facility].  He was then admitted to ICU for agitated delerium, developed apnea and required intubation with precedex.  Patient was just d/c this morning and was accepted to [facility] for Residential Tx.  When he arrived, he appeared paranoid, with psychosis and was sent to [facility] for further assessment and treatment for psychosis.  Per mother, he will likely be accepted once treated for psychosis.    Patient is unable to engage in assessment, he appears agitated and mumbles to mother after each question.  He could be heard whispering to mother, "it's okay, we are going to be okay.  We are safe."  Mother has some pertinent hx, however she has limited information regarding his SA hx.  She is aware of his meth, fentanyl and heroin use but doesn't  know about use hx/patterns.  She confirms she has been with patient since he was d/c from [facility] this morning.  She states patient is homeless and lives in a tent behind [address] on [address]. No other psych hx per mother.  Patient does not deny or confirm SI, does not answer questions.  Mother is concerned about patient's worsening paranoia and AH.    Chief Complaint: No chief complaint on file.  Visit Diagnosis: Opioid Use Disorder, moderate                             Stimulant Use Disorder, Amphetamine type, moderate   CCA Screening, Triage and Referral (STR)  Patient Reported Information How did you hear about [place]? Family/Friend  What Is the Reason for Your Visit/Call Today? Patient presents with mother was just d/c from [facility], admission after seen here at [facility] on Thurs for agitated delerium. Patient was just d/c this morning and was accepted to [facility] for Residential Tx.  When he arrived, he appeared paranoid, with psychosis and was sent here for treatment.  Per mother, he will likely be accepted once treated for psychosis.  Patient is unable to engaged in assessment, he appears agitated and mumbles to mother after each question.  Mother has some pertinent hx, however she has limited info regarding his SA hx.  She is aware of his meth, fentanyl and heroin use but doesn't know about use hx/patterns.  She states patient is homeless and lives in a tent behind [address] on [address]. No other psych hx per mother.  Patient does not deny or confirm  SI, does not answer questions. Mother is concerned about patient's worsening paranoia and AH.  How Long Has This Been Causing You Problems? > than 6 months  What Do You Feel Would Help You the Most Today? Alcohol or Drug Use Treatment   Have You Recently Had Any Thoughts About Hurting Yourself? -- (UTA)  Are You Planning to Commit Suicide/Harm Yourself At This time? -- (UTA)   Have you Recently Had Thoughts About Hurting Someone Else? -- (UTA)  Are You  Planning to Harm Someone at This Time? -- (UTA)  Explanation: No data recorded  Have You Used Any Alcohol or Drugs in the Past 24 Hours? No  How Long Ago Did You Use Drugs or Alcohol? No data recorded What Did You Use and How Much? No data recorded  Do You Currently Have a Therapist/Psychiatrist? No  Name of Therapist/Psychiatrist: No data recorded  Have You Been Recently Discharged From Any Office Practice or Programs? Yes  Explanation of Discharge From Practice/Program: D/C from [facility] for agitated delerium today     CCA Screening Triage Referral Assessment Type of Contact: Face-to-Face  Telemedicine Service Delivery:   Is this Initial or Reassessment? No data recorded Date Telepsych consult ordered in [facility]:  No data recorded Time Telepsych consult ordered in [facility]:  No data recorded Location of Assessment: [facility] Assessment Services  Provider Location: [facility] Assessment Services   Collateral Involvement: Mother, Cecile Hearing, was present and participated with pt's verbal permission.   Does Patient Have a Automotive engineer Guardian? No data recorded Name and Contact of Legal Guardian: No data recorded If Minor and Not Living with Parent(s), Who has Custody? No data recorded Is CPS involved or ever been involved? Never  Is APS involved or ever been involved? Never   Patient Determined To Be At Risk for Harm To Self or Others Based on Review of Patient Reported Information or Presenting Complaint? -- (UTA)  Method: No data recorded Availability of Means: No data recorded Intent: No data recorded Notification Required: No data recorded Additional Information for Danger to Others Potential: No data recorded Additional Comments for Danger to Others Potential: No data recorded Are There Guns or Other Weapons in Your Home? No data recorded Types of Guns/Weapons: No data recorded Are These Weapons Safely Secured?                            No data recorded Who Could  Verify You Are Able To Have These Secured: No data recorded Do You Have any Outstanding Charges, Pending Court Dates, Parole/Probation? No data recorded Contacted To Inform of Risk of Harm To Self or Others: Family/Significant Other:    Does Patient Present under Involuntary Commitment? No  IVC Papers Initial File Date: No data recorded  [place] of Residence: [place]   Patient Currently Receiving the Following Services: Not Receiving Services   Determination of Need: Urgent (48 hours)   Options For Referral: ED Referral; Facility-Based Crisis; Inpatient Hospitalization; BH Urgent Care     CCA Biopsychosocial Patient Reported Schizophrenia/Schizoaffective Diagnosis in Past: No   Strengths: uta   Mental Health Symptoms Depression:   Fatigue; Irritability; Difficulty Concentrating   Duration of Depressive symptoms:  Duration of Depressive Symptoms: Greater than two weeks   Mania:   None   Anxiety:    Worrying; Tension   Psychosis:   Delusions; Hallucinations   Duration of Psychotic symptoms:  Duration of Psychotic Symptoms: Less than  six months   Trauma:   None   Obsessions:   None   Compulsions:   None   Inattention:   N/A   Hyperactivity/Impulsivity:   N/A   Oppositional/Defiant Behaviors:   N/A   Emotional Irregularity:   Mood lability; Potentially harmful impulsivity   Other Mood/Personality Symptoms:   uta    Mental Status Exam Appearance and self-care  Stature:   Average   Weight:   Overweight   Clothing:   Dirty; Disheveled   Grooming:   Neglected   Cosmetic use:   None   Posture/gait:   Slumped   Motor activity:   Restless; Slowed; Agitated   Sensorium  Attention:   Distractible   Concentration:   Scattered   Orientation:   X5   Recall/memory:   Normal   Affect and Mood  Affect:   Depressed; Flat   Mood:   Depressed; Dysphoric; Worthless; Hopeless   Relating  Eye contact:   Avoided   Facial  expression:   Depressed   Attitude toward examiner:   Cooperative; Guarded; Irritable; Suspicious   Thought and Language  Speech flow:  Clear and Coherent; Paucity; Slow; Soft   Thought content:   Delusions; Persecutions   Preoccupation:   None   Hallucinations:   Auditory; Visual; Command (Comment) (to kill himself)   Organization:  No data recorded  [employer] of Knowledge:   -- Rich Reining)   Intelligence:   -- Industrial/product designer)   Abstraction:   Functional   Judgement:   Impaired   Reality Testing:   Distorted   Insight:   Lacking   Decision Making:   Impulsive   Social Functioning  Social Maturity:   Impulsive   Social Judgement:   Heedless   Stress  Stressors:   Family conflict; Housing; Surveyor, quantity; Relationship; Other (Comment) (drug use)   Coping Ability:   Overwhelmed; Exhausted   Skill Deficits:   -- Rich Reining)   Supports:   Family; Support needed     Religion: Religion/Spirituality Are You A Religious Person?: No Rich Reining)  Leisure/Recreation: Leisure / Recreation Do You Have Hobbies?:  Rich Reining)  Exercise/Diet: Exercise/Diet Do You Exercise?:  (uta) Have You Gained or Lost A Significant Amount of Weight in the Past Six Months?:  (uta) Do You Follow a Special Diet?: No Do You Have Any Trouble Sleeping?: Yes Explanation of Sleeping Difficulties: homelessness complicates   CCA Employment/Education Employment/Work Situation:    Education:     CCA Family/Childhood History Family and Relationship History: Family history Does patient have children?: Yes How many children?: 1 (25 yo son)  Childhood History:  Childhood History By whom was/is the patient raised?: Mother Did patient suffer any verbal/emotional/physical/sexual abuse as a child?: No Did patient suffer from severe childhood neglect?: No Has patient ever been sexually abused/assaulted/raped as an adolescent or adult?: No Was the patient ever a victim of a crime or a  disaster?: No Witnessed domestic violence?: No Has patient been affected by domestic violence as an adult?: No  Child/Adolescent Assessment:     CCA Substance Use Alcohol/Drug Use: Alcohol / Drug Use Pain Medications: see MAR Prescriptions: see MAR Over the Counter: see MAR History of alcohol / drug use?: Yes Longest period of sobriety (when/how long): UTA Substance #1 Name of Substance 1: Opioids - heroin/fentanyl 1 - Age of First Use: UTA 1 - Amount (size/oz): UTA 1 - Frequency: UTA 1 - Duration: ongoing 1 - Last Use / Amount: Last Friday 1 - Method  of Aquiring: NA 1- Route of Use: NA Substance #2 Name of Substance 2: meth 2 - Age of First Use: UTA 2 - Amount (size/oz): UTA 2 - Frequency: daily 2 - Duration: ongoing 2 - Last Use / Amount: Friday 2 - Method of Aquiring: UTA 2 - Route of Substance Use: UTA                     ASAM's:  Six Dimensions of Multidimensional Assessment  Dimension 1:  Acute Intoxication and/or Withdrawal Potential:   Dimension 1:  Description of individual's past and current experiences of substance use and withdrawal: Unable to assess patient's awareness of SA issues due to refusal to engage in assessment  Dimension 2:  Biomedical Conditions and Complications:      Dimension 3:  Emotional, Behavioral, or Cognitive Conditions and Complications:     Dimension 4:  Readiness to Change:     Dimension 5:  Relapse, Continued use, or Continued Problem Potential:     Dimension 6:  Recovery/Living Environment:     ASAM Severity Score:    ASAM Recommended Level of Treatment:     Substance use Disorder (SUD)    Recommendations for Services/Supports/Treatments:    Discharge Disposition:    DSM5 Diagnoses: Patient Active Problem List   Diagnosis Date Noted   CAP (community acquired pneumonia) 12/22/2021   Hypokalemia 12/22/2021   Prediabetes 12/22/2021   Polysubstance abuse (HCC) 12/22/2021   Acute kidney injury (HCC) 12/22/2021    Non-traumatic rhabdomyolysis 12/22/2021   Toxic encephalopathy 12/18/2021     Referrals to Alternative Service(s): Referred to Alternative Service(s):   Place:   Date:   Time:    Referred to Alternative Service(s):   Place:   Date:   Time:    Referred to Alternative Service(s):   Place:   Date:   Time:    Referred to Alternative Service(s):   Place:   Date:   Time:     Yetta Glassman, [facility]

## 2021-12-23 NOTE — ED Triage Notes (Signed)
Pt was discharged this morning from inpatient.  Pt was at [facility] and they feel he is still "detoxing" as he is reportedly "hallucinating"  and cannot be seen there until he is re evaluated at the hospital  Voluntary.  No SI/HI  Just "wants help"

## 2021-12-29 ENCOUNTER — Telehealth (HOSPITAL_COMMUNITY): Payer: Self-pay | Admitting: General Practice

## 2021-12-29 NOTE — BH Assessment (Signed)
Care Management - [facility]UC Follow Up Discharges   Writer attempted to make contact with patient today and was unsuccessful.  Phone is disconnected.  Per chart review, patient is homeless.  Patient wsa provided with outpatient resources.

## 2021-12-30 ENCOUNTER — Other Ambulatory Visit (HOSPITAL_COMMUNITY): Payer: Self-pay

## 2022-01-12 ENCOUNTER — Inpatient Hospital Stay (INDEPENDENT_AMBULATORY_CARE_PROVIDER_SITE_OTHER): Payer: Self-pay | Admitting: Primary Care

## 2022-01-13 ENCOUNTER — Ambulatory Visit: Payer: Self-pay | Admitting: Licensed Clinical Social Worker

## 2022-01-18 ENCOUNTER — Encounter (HOSPITAL_COMMUNITY): Payer: Self-pay

## 2022-01-18 ENCOUNTER — Emergency Department (HOSPITAL_COMMUNITY)
Admission: EM | Admit: 2022-01-18 | Discharge: 2022-01-19 | Disposition: A | Discharge status: Home / Self Care | Payer: Self-pay | Attending: Emergency Medicine | Admitting: Emergency Medicine

## 2022-01-18 ENCOUNTER — Other Ambulatory Visit: Payer: Self-pay

## 2022-01-18 DIAGNOSIS — F1910 Other psychoactive substance abuse, uncomplicated: Secondary | ICD-10-CM | POA: Insufficient documentation

## 2022-01-18 DIAGNOSIS — E119 Type 2 diabetes mellitus without complications: Secondary | ICD-10-CM | POA: Insufficient documentation

## 2022-01-18 DIAGNOSIS — F29 Unspecified psychosis not due to a substance or known physiological condition: Secondary | ICD-10-CM | POA: Insufficient documentation

## 2022-01-18 DIAGNOSIS — Z20822 Contact with and (suspected) exposure to covid-19: Secondary | ICD-10-CM | POA: Insufficient documentation

## 2022-01-18 LAB — CBC WITH DIFFERENTIAL/PLATELET
Abs Immature Granulocytes: 0.02 10*3/uL (ref 0.00–0.07)
Basophils Absolute: 0 10*3/uL (ref 0.0–0.1)
Basophils Relative: 0 %
Eosinophils Absolute: 0.1 10*3/uL (ref 0.0–0.5)
Eosinophils Relative: 1 %
HCT: 51.3 % (ref 39.0–52.0)
Hemoglobin: 15.5 g/dL (ref 13.0–17.0)
Immature Granulocytes: 0 %
Lymphocytes Relative: 35 %
Lymphs Abs: 2.7 10*3/uL (ref 0.7–4.0)
MCH: 22.8 pg — ABNORMAL LOW (ref 26.0–34.0)
MCHC: 30.2 g/dL (ref 30.0–36.0)
MCV: 75.3 fL — ABNORMAL LOW (ref 80.0–100.0)
Monocytes Absolute: 0.3 10*3/uL (ref 0.1–1.0)
Monocytes Relative: 4 %
Neutro Abs: 4.7 10*3/uL (ref 1.7–7.7)
Neutrophils Relative %: 60 %
Platelets: 270 10*3/uL (ref 150–400)
RBC: 6.81 MIL/uL — ABNORMAL HIGH (ref 4.22–5.81)
RDW: 17.5 % — ABNORMAL HIGH (ref 11.5–15.5)
WBC: 7.9 10*3/uL (ref 4.0–10.5)
nRBC: 0 % (ref 0.0–0.2)

## 2022-01-18 LAB — COMPREHENSIVE METABOLIC PANEL
ALT: 11 U/L (ref 0–44)
AST: 13 U/L — ABNORMAL LOW (ref 15–41)
Albumin: 4.1 g/dL (ref 3.5–5.0)
Alkaline Phosphatase: 68 U/L (ref 38–126)
Anion gap: 10 (ref 5–15)
BUN: 7 mg/dL (ref 6–20)
CO2: 25 mmol/L (ref 22–32)
Calcium: 9.4 mg/dL (ref 8.9–10.3)
Chloride: 109 mmol/L (ref 98–111)
Creatinine, Ser: 1.13 mg/dL (ref 0.61–1.24)
GFR, Estimated: >60 mL/min (ref >60)
Glucose, Bld: 138 mg/dL — ABNORMAL HIGH (ref 70–99)
Potassium: 3.4 mmol/L — ABNORMAL LOW (ref 3.5–5.1)
Sodium: 144 mmol/L (ref 135–145)
Total Bilirubin: 1 mg/dL (ref 0.3–1.2)
Total Protein: 7.4 g/dL (ref 6.5–8.1)

## 2022-01-18 LAB — RESP PANEL BY RT-PCR (FLU A&B, COVID) ARPGX2
Influenza A by PCR: NEGATIVE
Influenza B by PCR: NEGATIVE
SARS Coronavirus 2 by RT PCR: NEGATIVE

## 2022-01-18 LAB — SALICYLATE LEVEL: Salicylate Lvl: <7 mg/dL — ABNORMAL LOW (ref 7.0–30.0)

## 2022-01-18 LAB — ETHANOL: Alcohol, Ethyl (B): <10 mg/dL (ref <10)

## 2022-01-18 MED ORDER — ACETAMINOPHEN 325 MG PO TABS: 650.0000 mg | ORAL_TABLET | ORAL | Status: DC | PRN

## 2022-01-18 MED ORDER — OLANZAPINE 5 MG PO TBDP: 5.0000 mg | ORAL_TABLET | Freq: Three times a day (TID) | ORAL | Status: DC | PRN

## 2022-01-18 MED ORDER — HYDRALAZINE HCL 25 MG PO TABS: 25.0000 mg | ORAL_TABLET | Freq: Once | ORAL | Status: DC

## 2022-01-18 MED ORDER — AMLODIPINE BESYLATE 5 MG PO TABS: 10.0000 mg | ORAL_TABLET | Freq: Every day | ORAL | Status: DC

## 2022-01-18 MED ORDER — NICOTINE 21 MG/24HR TD PT24: 21.0000 mg | MEDICATED_PATCH | Freq: Every day | TRANSDERMAL | Status: DC

## 2022-01-18 MED ORDER — ONDANSETRON HCL 4 MG PO TABS: 4.0000 mg | ORAL_TABLET | Freq: Three times a day (TID) | ORAL | Status: DC | PRN

## 2022-01-18 MED ORDER — LORAZEPAM 1 MG PO TABS: 1.0000 mg | ORAL_TABLET | ORAL | Status: DC | PRN

## 2022-01-18 MED ORDER — ALUM & MAG HYDROXIDE-SIMETH 200-200-20 MG/5ML PO SUSP: 30.0000 mL | Freq: Four times a day (QID) | ORAL | Status: DC | PRN

## 2022-01-18 MED ORDER — ZIPRASIDONE MESYLATE 20 MG IM SOLR: 20.0000 mg | INTRAMUSCULAR | Status: DC | PRN

## 2022-01-18 NOTE — Progress Notes (Addendum)
Per Alan Mulder, NP pt is psych cleared. CSW will now remove pt from the [facility] shift report.   Maryjean Ka, MSW, [facility] 01/18/2022 5:31 PM

## 2022-01-18 NOTE — Consult Note (Addendum)
Telepsych Consultation   Reason for Consult: Psych consult Referring Physician: Dr. Jacqulyn Bath [facility] of [facility]: [name] Location of Provider: [facility]  Patient Identification: Jimmy Jarvis  MRN:  [id number]  Principal Diagnosis: <principal problem not specified>  Diagnosis:  Polysubstance abuse  Total Time spent with patient: 1 hour  Subjective:   Jimmy Jarvis is a 31 y.o. male patient.  HPI: As per Jimmy Jarvis, ED initial intake note: Jimmy Jarvis is a 31 year old African-American male who was brought in by [facility] Department via IVC by patient's mother.  IVC paperwork indicates, Pt "has a mental illness and is a dangerous to self or others or has a mental illness and is in need of treatment in order to prevent further disability or deterioration that would predictably result in dangerousness" Pt "uses heroin, meth and fentanyl-substance abuser. Staring off into space. Has been committed to [address], [address], [address], [employer]. Hearing voices, laughing for no reason. Not eating or bathing. Paranoid. Memory Loss. Slow response to questions or alert responding to questions."  Pt denies HI, denies SI. [employer] denies violence or aggression for them.   Assessment: Patient is seen and examined via telepsych.  Appears calm and nonverbal initially, with continuous assessment, patient becomes verbal occasionally.  Chart reviewed and assessment shared with the treatment team and discussed with Dr. Lucianne Muss.  Alert and oriented x 3, to person, time, and place.  When asked what brought him to the hospital, reports that he caught the bus and nothing really brought him to the hospital then added, "I do not know."  Patient also reported, "I am chilling and it is what it is."  When asked if he was using heroine, methamphetamine, and fentanyl, patient reports, "I did that last month."  However, refused to inform this provider how much of these drugs he was using.   As per chart review patient refused blood drawn and providing urine sample for urine drug screen.  Maintains fair eye contact with the provider, and speech coherent whenever he prefers to speak.  Presents with anxious, depressed and dysphoric mood with coherent affect.  Thought process coherent, however, thought content with rumination, and paranoid ideation.  Sensorium with memory immediate fair, judgment poor and insight shallow.  Patient does not seem to be responding to internal or external stimuli.  Patient denies suicidal ideation, homicidal ideation, paranoia, delusion, auditory or visual hallucinations. Reports sleeping up to 7 hours last night, and reported good appetite and drinking enough fluid for hydration. When asked if he is safe at home reports, "I am homeless."  However, denies access to firearms, denies self-injurious behavior, denies any suicide attempt in the past or currently, denies being on any medication prior to hospitalization, denies family history of mental illness.  Reports, "I cannot remember" when asked when last he visited with a therapist or psychiatrist. Reports using heroin, methamphetamine fentanyl and vaping tobacco "a little bit."  Instructions provided on the impact of illicit drugs on overall psychiatric and medical wellbeing, and encourage total abstinence from Polysubstance use.  Last urine drug screen on 12/17/2021 / 12/18/2021 indicates positive for amphetamines and benzodiazepines and cocaine use.         Disposition: No evidence of imminent risk to self or others at present.   Patient does not meet criteria for psychiatric inpatient admission.   However, since patient refused to answer some of my questions and refusing lab work, I would recommend  re-evaluation of patient tomorrow to  see if there is any symptoms of substance withdrawal, suicidal ideation, homicidal ideation, paranoia, delusional or AVH.     Past Psychiatric History: Polysubstance abuse, Has  been committed to [address], [place], [employer], [employer]. Hearing voices, laughing for no reason. Not eating or bathing. Paranoid. Memory Loss. Slow response to questions or refusing to answer questions."  Risk to Self:   Risk to Others:   Prior Inpatient Therapy:   Prior Outpatient Therapy:    Past Medical History:  Past Medical History:  Diagnosis Date   Diabetes mellitus without complication (HCC)    Drug abuse (HCC)     Past Surgical History:  Procedure Laterality Date   HIP SURGERY  2005   L hip dislocation-pin inserted    Family History: History reviewed. No pertinent family history.  Family Psychiatric  History: None indicated  Social History:  Social History   Substance and Sexual Activity  Alcohol Use Yes   Comment: occasional     Social History   Substance and Sexual Activity  Drug Use Yes   Comment: Heroin last use 12/15/21    Social History   Socioeconomic History   Marital status: Single    Spouse name: Not on file   Number of children: Not on file   Years of education: Not on file   Highest education level: Not on file  Occupational History   Not on file  Tobacco Use   Smoking status: Every Day    Packs/day: 0.50    Types: Cigarettes   Smokeless tobacco: Never  Substance and Sexual Activity   Alcohol use: Yes    Comment: occasional   Drug use: Yes    Comment: Heroin last use 12/15/21   Sexual activity: Not on file  Other Topics Concern   Not on file  Social History Narrative   Not on file   Social Determinants of Health   Financial Resource Strain: Not on file  Food Insecurity: Not on file  Transportation Needs: Not on file  Physical Activity: Not on file  Stress: Not on file  Social Connections: Not on file   Additional Social History:    Allergies:  No Known Allergies  Labs: No results found for this or any previous visit (from the past 48 hour(s)).  Medications:  Current Facility-Administered Medications  Medication Dose  Route Frequency Provider Last Rate Last Admin   acetaminophen (TYLENOL) tablet 650 mg  650 mg Oral Q4H PRN Long, Arlyss Repress, MD       alum & mag hydroxide-simeth (MAALOX/MYLANTA) 200-200-20 MG/5ML suspension 30 mL  30 mL Oral Q6H PRN Long, Arlyss Repress, MD       OLANZapine zydis (ZYPREXA) disintegrating tablet 5 mg  5 mg Oral Q8H PRN Long, Arlyss Repress, MD       And   LORazepam (ATIVAN) tablet 1 mg  1 mg Oral PRN Long, Arlyss Repress, MD       And   ziprasidone (GEODON) injection 20 mg  20 mg Intramuscular PRN Long, Arlyss Repress, MD       nicotine (NICODERM CQ - dosed in mg/24 hours) patch 21 mg  21 mg Transdermal Daily Long, Arlyss Repress, MD       ondansetron [facility]) tablet 4 mg  4 mg Oral Q8H PRN Long, Arlyss Repress, MD       Current Outpatient Medications  Medication Sig Dispense Refill   amLODipine (NORVASC) 10 MG tablet Take 1 tablet by mouth daily. (Patient not taking: Reported on  01/18/2022)      Musculoskeletal: Strength & Muscle Tone: within normal limits Gait & Station: normal Patient leans: N/A  Psychiatric Specialty Exam:  Presentation  General Appearance: Appropriate for Environment; Casual; Fairly Groomed  Eye Contact:Fair  Speech:Clear and Coherent  Speech Volume:Normal  Handedness:Right  Mood and Affect  Mood:Anxious; Depressed; Dysphoric  Affect:Congruent  Thought Process  Thought Processes:Coherent  Descriptions of Associations:Intact  Orientation:Full (Time, Place and Person)  Thought Content:Logical; Paranoid Ideation; Rumination  History of Schizophrenia/Schizoaffective disorder:No  Duration of Psychotic Symptoms:N/A  Hallucinations:Hallucinations: None  Ideas of Reference:None  Suicidal Thoughts:Suicidal Thoughts: No  Homicidal Thoughts:Homicidal Thoughts: No  Sensorium  Memory:Immediate Fair; Recent Fair; Remote Fair  Judgment:Poor  Insight:Shallow  Executive Functions  Concentration:Fair  Attention Span:Fair  Recall:Poor  Fund of  Knowledge:Fair  Language:Fair  Psychomotor Activity  Psychomotor Activity:Psychomotor Activity: Normal  Assets  Assets:Communication Skills; Physical Health (Patient is homeless.)  Sleep  Sleep:Sleep: Good Number of Hours of Sleep: 6.5 (Patient states maybe 6 or 7 hours)  Physical Exam: Physical Exam Vitals and nursing note reviewed.  Constitutional:      Appearance: Normal appearance.  HENT:     Head: Normocephalic and atraumatic.     Right Ear: External ear normal.     Left Ear: External ear normal.     Nose: Nose normal.     Mouth/Throat:     Mouth: Mucous membranes are moist.     Pharynx: Oropharynx is clear.  Eyes:     Extraocular Movements: Extraocular movements intact.     Conjunctiva/sclera: Conjunctivae normal.     Pupils: Pupils are equal, round, and reactive to light.  Cardiovascular:     Rate and Rhythm: Normal rate.     Pulses: Normal pulses.     Comments: Blood pressure 188/138, pulse 100.  Nursing staff to recheck vital signs. Pulmonary:     Effort: Pulmonary effort is normal.  Abdominal:     Palpations: Abdomen is soft.  Genitourinary:    Comments: Deferred Musculoskeletal:        General: Normal range of motion.     Cervical back: Normal range of motion and neck supple.  Skin:    General: Skin is warm.  Neurological:     General: No focal deficit present.     Mental Status: He is oriented to person, place, and time.  Psychiatric:        Mood and Affect: Mood normal.     Comments: Refusing to respond to some questions, stating, "I am chilling, it is what it is."    Review of Systems  Constitutional: Negative.  Negative for chills and fever.  HENT: Negative.  Negative for hearing loss and tinnitus.   Eyes: Negative.  Negative for blurred vision and double vision.  Respiratory: Negative.  Negative for cough, sputum production, shortness of breath and wheezing.   Cardiovascular: Negative.  Negative for chest pain and palpitations.       Blood  pressure 188/138, pulse 100.  Nursing staff to recheck vital signs.   Gastrointestinal: Negative.  Negative for abdominal pain, constipation, diarrhea, heartburn, nausea and vomiting.  Genitourinary: Negative.  Negative for dysuria, frequency and urgency.  Musculoskeletal: Negative.  Negative for myalgias and neck pain.  Skin: Negative.  Negative for rash.  Neurological: Negative.  Negative for dizziness and headaches.  Endo/Heme/Allergies: Negative.  Negative for environmental allergies and polydipsia. Does not bruise/bleed easily.  Psychiatric/Behavioral: Negative.     Blood pressure (!) 188/138, pulse 100, temperature 99.4 F (37.4  C), temperature source Oral, resp. rate 18, SpO2 100 %. There is no height or weight on file to calculate BMI.  Treatment Plan Summary: Daily contact with patient to assess and evaluate symptoms and progress in treatment and Medication management  Disposition: No evidence of imminent risk to self or others at present.   Patient does not meet criteria for psychiatric inpatient admission.   However, would like to reevaluate patient tomorrow to see if there is any symptoms of substance withdrawal, suicidal ideation, homicidal ideation, paranoia, delusional or AVH, since patient refused to answer some of my questions and refusing lab work.   This service was provided via telemedicine using a 2-way, interactive audio and video technology.  Names of all persons participating in this telemedicine service and their role in this encounter. Name: Justice Milliron Role: Patient  Name: Alan Mulder, NP Role: Provider  Name: Dr. Lucianne Muss Role: Medical Director  Name: Dr. Jacqulyn Bath Role: [name], ED physician    Cecilie Lowers, FNP 01/18/2022 3:03 PM

## 2022-01-18 NOTE — ED Notes (Signed)
Pt belongings bag labeled. A blue phone, wallet and vape device inside ziplock bag put away in a cabinet.

## 2022-01-18 NOTE — ED Notes (Signed)
Dr. Jacqulyn Bath aware patient has refused blood work and urine sample. Pt refused food at this time.

## 2022-01-18 NOTE — ED Notes (Signed)
MD made aware that pt is refusing medications and that his BP is 171/106.

## 2022-01-18 NOTE — ED Provider Notes (Signed)
Patient signed to me by prior provider pending results of laboratory studies.  Labs were obtained and are without significant abnormality.  Patient is hypertensive but refuses to take his medications.  He is otherwise medically clear for psychiatric disposition   Lorre Nick, MD 01/18/22 1800

## 2022-01-18 NOTE — ED Notes (Signed)
Pt refused urine sample, refused blood draw.

## 2022-01-18 NOTE — ED Provider Notes (Signed)
Emergency Department Provider Note   I have reviewed the triage vital signs and the nursing notes.   HISTORY  Chief Complaint IVC and Psychiatric Evaluation   HPI Jimmy Jarvis is a 31 y.o. male with past history of polysubstance abuse and diabetes presents to the emergency department under IVC taken out by his mother with concern for his deteriorating mental health.  She tells me that he has been living in a transitional home and, as far she knows, off of all drugs.  He is becoming increasingly withdrawn.  She describes him staring out the windows for hours and being up all night.  He is becoming increasingly agitated with his roommates although no physical violence or threats that she is aware of.  No thoughts of harming herself but states that she took him to a family funeral this past weekend and he got out of the car and immediately began walking into traffic telling her that he was walking back to [place].  He seemed unaware of the passing cars and unconcerned for his safety.  She has tried to take him to an outpatient psychiatrist but he will not get out of the car and go inside.  When I speak with him today keeps saying "I'm good. I just want to get out of here" but will not engage in further discussion. Denies SI. Denies drug or EtOH use.    Past Medical History:  Diagnosis Date   Diabetes mellitus without complication (HCC)    Drug abuse (HCC)     Review of Systems  Constitutional: No fever/chills Eyes: No visual changes. ENT: No sore throat. Cardiovascular: Denies chest pain. Respiratory: Denies shortness of breath. Gastrointestinal: No abdominal pain.  No nausea, no vomiting.  No diarrhea.  No constipation. Genitourinary: Negative for dysuria. Musculoskeletal: Negative for back pain. Skin: Negative for rash. Neurological: Negative for headaches, focal weakness or numbness. Psychiatric: Apparent change in affect,  AH.   ____________________________________________   PHYSICAL EXAM:  VITAL SIGNS: ED Triage Vitals [01/18/22 1306]  Enc Vitals Group     BP (!) 151/86     Pulse Rate 95     Resp 18     Temp 98.5 F (36.9 C)     Temp Source Oral     SpO2 99 %   Constitutional: Alert. No distress.  Eyes: Conjunctivae are normal.  Head: Atraumatic. Nose: No congestion/rhinnorhea. Mouth/Throat: Mucous membranes are moist.   Neck: No stridor.  Cardiovascular: Normal rate, regular rhythm. Good peripheral circulation. Grossly normal heart sounds.   Respiratory: Normal respiratory effort.  No retractions. Lungs CTAB. Gastrointestinal: Soft and nontender. No distention.  Musculoskeletal: No lower extremity tenderness nor edema. No gross deformities of extremities. Neurologic:  Normal speech and language. No gross focal neurologic deficits are appreciated.  Skin:  Skin is warm, dry and intact. No rash noted. Psychiatric: Mood and affect are flat.  Behavior somewhat bizarre and poorly focused.  Not clearly responding to internal stimulus.   ____________________________________________   LABS (all labs ordered are listed, but only abnormal results are displayed)  Labs Reviewed  RESP PANEL BY RT-PCR (FLU A&B, COVID) ARPGX2  COMPREHENSIVE METABOLIC PANEL  ETHANOL  SALICYLATE LEVEL  CBC WITH DIFFERENTIAL/PLATELET  RAPID URINE DRUG SCREEN, HOSP PERFORMED    ____________________________________________   PROCEDURES  Procedure(s) performed:   Procedures  None  ____________________________________________   INITIAL IMPRESSION / ASSESSMENT AND PLAN / ED COURSE  Pertinent labs & imaging results that were available during my care of the  patient were reviewed by me and considered in my medical decision making (see chart for details).   This patient is Presenting for Evaluation of psych clearance, which does require a range of treatment options, and is a complaint that involves a high risk of  morbidity and mortality.  The Differential Diagnoses includes but is not exclusive to alcohol, illicit or prescription medications, intracranial pathology such as stroke, intracerebral hemorrhage, fever or infectious causes including sepsis, hypoxemia, uremia, trauma, endocrine related disorders such as diabetes, hypoglycemia, thyroid-related diseases, etc.   Critical Interventions-    Medications  OLANZapine zydis (ZYPREXA) disintegrating tablet 5 mg (has no administration in time range)    And  LORazepam (ATIVAN) tablet 1 mg (has no administration in time range)    And  ziprasidone (GEODON) injection 20 mg (has no administration in time range)  nicotine (NICODERM CQ - dosed in mg/24 hours) patch 21 mg (21 mg Transdermal Patient Refused/Not Given 01/18/22 1353)  alum & mag hydroxide-simeth (MAALOX/MYLANTA) 200-200-20 MG/5ML suspension 30 mL (has no administration in time range)  ondansetron (ZOFRAN) tablet 4 mg (has no administration in time range)  acetaminophen (TYLENOL) tablet 650 mg (has no administration in time range)    Reassessment after intervention:  Calm and cooperative.    I did obtain Additional Historical Information from Police and Mom by phone.  I decided to review pertinent External Data, and in summary patient last seen by behavioral health on 8/9 ultimately discharged with outpatient resources.  Past encounters seem to have focused on the patient's polysubstance abuse as a cause of symptoms but patient and mom deny concern for that currently.    Social Determinants of Health Risk patient is a smoker. Denies polysubstance abuse.   Consult complete with TTS.   Medical Decision Making: Summary:  Patient arrives under IVC taken out by mom.  Patient is not endorsing any active suicidal ideation but per report has been wandering into traffic and very flat.  He has been up for many hours in the night and staring off.  Mom notes that he is talking to himself as well.   Question if patient may be using drugs again and not endorsing this versus perhaps underlying psychosis.   Reevaluation with update and discussion with patient. Continues to refuse labs. HDS. Patient under IVC but cannot force labs.   Disposition: pending   ____________________________________________  FINAL CLINICAL IMPRESSION(S) / ED DIAGNOSES  Final diagnoses:  Psychosis, unspecified psychosis type (HCC)    Note:  This document was prepared using Dragon voice recognition software and may include unintentional dictation errors.  Alona Bene, MD, [facility]    Khiree Bukhari, Arlyss Repress, MD 01/18/22 [phone]

## 2022-01-18 NOTE — ED Triage Notes (Signed)
Pt BIB [employer] for IVC. Pt "has a mental illness and is a dangerous to self or others or has a mental illness and is in need of treatment in order to prevent further disability or deterioration that would predictably result in dangerousness." Pt "uses heroin, math and fentanyl-substance abuser. Staring off into space. Has been committed to [place], [address], [address], [employer]. Hearing voices, laughing for no reason. Not eating or bathing. Paranoid. Memory Loss. Slow response to questions." Pt mother took out IVC. Pt denies HI, denies SI. [employer] denies violence or aggression for them.

## 2022-01-18 NOTE — ED Notes (Signed)
TTS consult bedside.

## 2022-01-19 NOTE — Discharge Instructions (Addendum)
Follow-up as instructed by behavioral health 

## 2022-01-19 NOTE — ED Notes (Signed)
Pt expreses he would like to take a shower this morning. I advise I would let the oncoming nurse know this.

## 2022-01-19 NOTE — ED Notes (Addendum)
Opiod scale done due to PT being a Fentanyl and heroin user. Pt advised he feels fine...the patient is however pacing the floors.

## 2022-01-19 NOTE — ED Notes (Signed)
Pt still refuses to allow staff to assess vitals after multiple attempts and just saying "no I'm good"

## 2022-01-19 NOTE — ED Notes (Signed)
Pt is noted to be hypertensive. The RN that I got report from advised the pt would not take any medications earlier. I asked the pt if he would like to have something for his BP and he advised "nah I'm good." I attempted to do a CIWWA scale on pt but he would not answer questions either. CIWWA is based on my obervations.

## 2023-02-03 ENCOUNTER — Emergency Department (HOSPITAL_BASED_OUTPATIENT_CLINIC_OR_DEPARTMENT_OTHER)
Admission: EM | Admit: 2023-02-03 | Discharge: 2023-02-03 | Disposition: A | Discharge status: Home / Self Care | Payer: BC Managed Care – PPO | Attending: Emergency Medicine | Admitting: Emergency Medicine

## 2023-02-03 ENCOUNTER — Emergency Department (HOSPITAL_BASED_OUTPATIENT_CLINIC_OR_DEPARTMENT_OTHER): Payer: BC Managed Care – PPO

## 2023-02-03 ENCOUNTER — Other Ambulatory Visit: Payer: Self-pay

## 2023-02-03 ENCOUNTER — Encounter (HOSPITAL_BASED_OUTPATIENT_CLINIC_OR_DEPARTMENT_OTHER): Payer: Self-pay | Admitting: Emergency Medicine

## 2023-02-03 DIAGNOSIS — M79671 Pain in right foot: Secondary | ICD-10-CM | POA: Diagnosis present

## 2023-02-03 DIAGNOSIS — R600 Localized edema: Secondary | ICD-10-CM | POA: Diagnosis not present

## 2023-02-03 LAB — COMPREHENSIVE METABOLIC PANEL
ALT: 23 U/L (ref 0–44)
AST: 18 U/L (ref 15–41)
Albumin: 3.5 g/dL (ref 3.5–5.0)
Alkaline Phosphatase: 96 U/L (ref 38–126)
Anion gap: 10 (ref 5–15)
BUN: 15 mg/dL (ref 6–20)
CO2: 26 mmol/L (ref 22–32)
Calcium: 8.6 mg/dL — ABNORMAL LOW (ref 8.9–10.3)
Chloride: 101 mmol/L (ref 98–111)
Creatinine, Ser: 1.08 mg/dL (ref 0.61–1.24)
GFR, Estimated: >60 mL/min (ref >60)
Glucose, Bld: 125 mg/dL — ABNORMAL HIGH (ref 70–99)
Potassium: 3.8 mmol/L (ref 3.5–5.1)
Sodium: 137 mmol/L (ref 135–145)
Total Bilirubin: 0.5 mg/dL (ref 0.3–1.2)
Total Protein: 7.4 g/dL (ref 6.5–8.1)

## 2023-02-03 LAB — CBC WITH DIFFERENTIAL/PLATELET
Abs Immature Granulocytes: 0.05 10*3/uL (ref 0.00–0.07)
Basophils Absolute: 0.1 10*3/uL (ref 0.0–0.1)
Basophils Relative: 1 %
Eosinophils Absolute: 0.2 10*3/uL (ref 0.0–0.5)
Eosinophils Relative: 2 %
HCT: 45 % (ref 39.0–52.0)
Hemoglobin: 13.8 g/dL (ref 13.0–17.0)
Immature Granulocytes: 1 %
Lymphocytes Relative: 22 %
Lymphs Abs: 2.3 10*3/uL (ref 0.7–4.0)
MCH: 23.2 pg — ABNORMAL LOW (ref 26.0–34.0)
MCHC: 30.7 g/dL (ref 30.0–36.0)
MCV: 75.5 fL — ABNORMAL LOW (ref 80.0–100.0)
Monocytes Absolute: 0.8 10*3/uL (ref 0.1–1.0)
Monocytes Relative: 7 %
Neutro Abs: 7.1 10*3/uL (ref 1.7–7.7)
Neutrophils Relative %: 67 %
Platelets: 341 10*3/uL (ref 150–400)
RBC: 5.96 MIL/uL — ABNORMAL HIGH (ref 4.22–5.81)
RDW: 15.8 % — ABNORMAL HIGH (ref 11.5–15.5)
WBC: 10.4 10*3/uL (ref 4.0–10.5)
nRBC: 0 % (ref 0.0–0.2)

## 2023-02-03 LAB — BRAIN NATRIURETIC PEPTIDE: B Natriuretic Peptide: 39.7 pg/mL (ref 0.0–100.0)

## 2023-02-03 MED ORDER — MELOXICAM 7.5 MG PO TABS: 7.5000 mg | ORAL_TABLET | Freq: Every day | ORAL | 0 refills | Status: DC

## 2023-02-03 MED ORDER — MELOXICAM 7.5 MG PO TABS: 7.5000 mg | ORAL_TABLET | Freq: Every day | ORAL | 0 refills | Status: AC

## 2023-02-03 MED ORDER — KETOROLAC TROMETHAMINE 15 MG/ML IJ SOLN
15.0000 mg | Freq: Once | INTRAMUSCULAR | Status: AC
  Administered 2023-02-03: 15 mg via INTRAMUSCULAR
  Filled 2023-02-03: qty 1

## 2023-02-03 NOTE — ED Provider Notes (Signed)
[facility] Provider Note   CSN: [id number] Arrival date & time: 02/03/23  1855     History  Chief Complaint  Patient presents with   Foot Pain    ZAELYN YOUNGS is a 32 y.o. male with a past medical history of polysubstance use presents emergency department for bilateral feet pain that started roughly 3 to 4 days ago.  He stated that he has a history of gout in the past.  His current bilateral foot pain is ranked as a 8 out of 10.  He denies recent trauma or injuries to his feet.  Patient has attempted Tylenol and ibuprofen over-the-counter which has not helped with the pain.  Patient does state that he has been standing more at work which may be the reason why his feet have been hurting and swelling more.  He is currently not taking any allopurinol or maintenance medications for gout.       Home Medications Prior to Admission medications   Medication Sig Start Date End Date Taking? Authorizing Provider  amLODipine (NORVASC) 10 MG tablet Take 1 tablet by mouth daily. Patient not taking: Reported on 01/18/2022    [provider]  meloxicam (MOBIC) 7.5 MG tablet Take 1 tablet (7.5 mg total) by mouth daily. Take with meals 02/04/23   Faith Rogue, DO      Allergies    Patient has no known allergies.    Review of Systems   Review of Systems  Constitutional:  Negative for chills and fever.  Respiratory:  Negative for shortness of breath.   Cardiovascular:  Positive for leg swelling. Negative for chest pain.    Physical Exam Updated Vital Signs BP 136/73 (BP Location: Right Arm)   Pulse 99   Temp 99.2 F (37.3 C) (Oral)   Resp 16   Ht 5\' 9"  (1.753 m)   Wt 136.1 kg   SpO2 99%   BMI 44.30 kg/m  Physical Exam Vitals reviewed.  Constitutional:      General: He is awake. He is not in acute distress.    Appearance: He is obese. He is not ill-appearing, toxic-appearing or diaphoretic.  HENT:     Head:  Normocephalic.  Cardiovascular:     Rate and Rhythm: Normal rate and regular rhythm.     Heart sounds: Normal heart sounds.  Pulmonary:     Effort: Pulmonary effort is normal.     Breath sounds: Normal breath sounds.  Musculoskeletal:        General: Tenderness (bilateral dorsal foot tenderness) present.     Right lower leg: Edema present.     Left lower leg: Edema present.  Feet:     Right foot:     Skin integrity: No skin breakdown, erythema or warmth.     Left foot:     Skin integrity: No skin breakdown, erythema or warmth.     Comments: Bilateral tenderness on dorsum. No joint edema or pain.  Skin:    General: Skin is warm and dry.  Neurological:     Mental Status: He is alert and oriented to person, place, and time.  Psychiatric:        Behavior: Behavior is cooperative.     ED Results / Procedures / Treatments   Labs (all labs ordered are listed, but only abnormal results are displayed) Labs Reviewed  CBC WITH DIFFERENTIAL/PLATELET - Abnormal; Notable for the following components:      Result Value   RBC  5.96 (*)    MCV 75.5 (*)    MCH 23.2 (*)    RDW 15.8 (*)    All other components within normal limits  COMPREHENSIVE METABOLIC PANEL - Abnormal; Notable for the following components:   Glucose, Bld 125 (*)    Calcium 8.6 (*)    All other components within normal limits  BRAIN NATRIURETIC PEPTIDE    EKG None  Radiology DG Foot Complete Left  Result Date: 02/03/2023 CLINICAL DATA:  Left foot pain.  History of gout.  No known injury. EXAM: LEFT FOOT - COMPLETE 3+ VIEW COMPARISON:  None Available. FINDINGS: There is no evidence of fracture or dislocation. Slight hallux valgus. Otherwise normal alignment. Joint spaces are preserved. Soft tissues are unremarkable. IMPRESSION: Slight hallux valgus. Otherwise unremarkable radiographs of the left foot. Electronically Signed   By: Narda Rutherford M.D.   On: 02/03/2023 21:11   DG Foot Complete Right  Result Date:  02/03/2023 CLINICAL DATA:  Bilateral foot pain. No known injury. History of gout. EXAM: RIGHT FOOT COMPLETE - 3+ VIEW COMPARISON:  None Available. FINDINGS: There is no evidence of fracture or dislocation. Alignment and joint spaces are normal. There is no evidence of arthropathy or other focal bone abnormality. Soft tissues are unremarkable. IMPRESSION: Negative radiographs of the right foot. Electronically Signed   By: Narda Rutherford M.D.   On: 02/03/2023 21:10    Procedures Procedures    Medications Ordered in ED Medications  ketorolac (TORADOL) 15 MG/ML injection 15 mg (15 mg Intramuscular Given 02/03/23 2127)    ED Course/ Medical Decision Making/ A&P                                 Medical Decision Making Patient is a 32 year old male who presents with pain in his bilateral feet primary located on the dorsum.  Initial evaluation, patient is afebrile with normal vital signs.  Physical exam shows nonpitting edema present in the bilateral lower extremities to the level of the ankles.  There is no specific joint tenderness present on either foot, he is complaining of primarily diffuse dorsum foot pain.  Initial etiologies considered for fracture,septic arthritis, renal failure, hypoalbuminemia, heart failure due to history of polysubstance use.  It is most likely etiology for bilateral lower extremity edema and pain is due to increase in standing and walking at work.  Patient received injection of Toradol which improved his pain.  Bilateral x-rays do not show any acute fracture or injury.  Labs to evaluate for edema such as BNP and CMP do not show cause for edema.    Upon reevaluation, patient stated that he is ready to go home.  Patient was discharged home with prescription for meloxicam for pain and with instructions to elevate feet to help with edema and to follow-up with a PCP which I provided contact information for.  Amount and/or Complexity of Data Reviewed Labs: ordered.     Details: CBC does not show leukocytosis CMP shows normal renal function and normal albumin level BNP is within normal limits Radiology: ordered and independent interpretation performed.    Details: Agree with radiologist findings  Risk Prescription drug management.           Final Clinical Impression(s) / ED Diagnoses Final diagnoses:  Bilateral edema of lower extremity    Rx / DC Orders ED Discharge Orders          Ordered  meloxicam (MOBIC) 7.5 MG tablet  Daily        02/03/23 2156    meloxicam (MOBIC) 7.5 MG tablet  Daily,   Status:  Discontinued        02/03/23 2152              Faith Rogue, DO 02/03/23 2249    Melene Plan, DO 02/03/23 2255

## 2023-02-03 NOTE — ED Notes (Signed)
Pt. Has noted edema in his ankles bilat.  Pt. Reports he also has pain in both ankles.   No redness noted.

## 2023-02-03 NOTE — Discharge Instructions (Addendum)
You came to the emergency department for pain in your bilateral feet.  We are going to prescribe you once a day meloxicam for the next 2 weeks.  Please take this once a day, do not use ibuprofen while taking meloxicam.  To help with the foot swelling, you may elevate your feet when you are not standing.  I have attached information for a PCP for you become established with, please follow-up with them in 1 to 2 weeks or sooner if needed. If the pain worsens or you develop difficult breathing, please return to the emergency department.

## 2023-02-03 NOTE — ED Triage Notes (Signed)
Patient complaining of bilateral foot pain ongoing for 3-4 days. Patient states he has history of gout and feels like this is a flare up. Denies injury/trauma.

## 2023-02-04 ENCOUNTER — Encounter (HOSPITAL_BASED_OUTPATIENT_CLINIC_OR_DEPARTMENT_OTHER): Payer: Self-pay
